# Patient Record
Sex: Female | Born: 1965 | Race: White | Hispanic: No | Marital: Married | Smoking: Never smoker
Health system: Southern US, Community
[De-identification: ages and names within clinical notes are randomized; demographics above are authoritative.]

## PROBLEM LIST (undated history)

## (undated) DIAGNOSIS — E7800 Pure hypercholesterolemia, unspecified: Secondary | ICD-10-CM

## (undated) DIAGNOSIS — E2839 Other primary ovarian failure: Secondary | ICD-10-CM

## (undated) DIAGNOSIS — M1990 Unspecified osteoarthritis, unspecified site: Secondary | ICD-10-CM

## (undated) HISTORY — PX: ABDOMINAL HYSTERECTOMY: SHX81

---

## 1998-03-23 ENCOUNTER — Inpatient Hospital Stay (HOSPITAL_COMMUNITY): Admission: AD | Admit: 1998-03-23 | Discharge: 1998-03-23 | Payer: Self-pay | Admitting: *Deleted

## 1998-03-27 ENCOUNTER — Inpatient Hospital Stay (HOSPITAL_COMMUNITY): Admission: AD | Admit: 1998-03-27 | Discharge: 1998-03-29 | Payer: Self-pay | Admitting: Obstetrics and Gynecology

## 1998-05-11 ENCOUNTER — Other Ambulatory Visit: Admission: RE | Admit: 1998-05-11 | Discharge: 1998-05-11 | Payer: Self-pay | Admitting: Obstetrics and Gynecology

## 1999-05-18 ENCOUNTER — Other Ambulatory Visit: Admission: RE | Admit: 1999-05-18 | Discharge: 1999-05-18 | Payer: Self-pay | Admitting: Gynecology

## 2000-06-20 ENCOUNTER — Other Ambulatory Visit: Admission: RE | Admit: 2000-06-20 | Discharge: 2000-06-20 | Payer: Self-pay | Admitting: Gynecology

## 2001-07-03 ENCOUNTER — Other Ambulatory Visit: Admission: RE | Admit: 2001-07-03 | Discharge: 2001-07-03 | Payer: Self-pay | Admitting: Gynecology

## 2002-03-06 ENCOUNTER — Ambulatory Visit (HOSPITAL_COMMUNITY): Admission: RE | Admit: 2002-03-06 | Discharge: 2002-03-06 | Payer: Self-pay | Admitting: Obstetrics and Gynecology

## 2002-03-06 ENCOUNTER — Encounter: Payer: Self-pay | Admitting: Obstetrics and Gynecology

## 2002-08-07 ENCOUNTER — Inpatient Hospital Stay (HOSPITAL_COMMUNITY): Admission: AD | Admit: 2002-08-07 | Discharge: 2002-08-09 | Payer: Self-pay | Admitting: Obstetrics and Gynecology

## 2002-09-20 ENCOUNTER — Other Ambulatory Visit: Admission: RE | Admit: 2002-09-20 | Discharge: 2002-09-20 | Payer: Self-pay | Admitting: Obstetrics and Gynecology

## 2003-11-19 ENCOUNTER — Other Ambulatory Visit: Admission: RE | Admit: 2003-11-19 | Discharge: 2003-11-19 | Payer: Self-pay | Admitting: Gynecology

## 2004-12-13 ENCOUNTER — Other Ambulatory Visit: Admission: RE | Admit: 2004-12-13 | Discharge: 2004-12-13 | Payer: Self-pay | Admitting: Gynecology

## 2006-02-14 ENCOUNTER — Other Ambulatory Visit: Admission: RE | Admit: 2006-02-14 | Discharge: 2006-02-14 | Payer: Self-pay | Admitting: Gynecology

## 2007-07-10 ENCOUNTER — Other Ambulatory Visit: Admission: RE | Admit: 2007-07-10 | Discharge: 2007-07-10 | Payer: Self-pay | Admitting: Gynecology

## 2007-10-22 ENCOUNTER — Encounter: Admission: RE | Admit: 2007-10-22 | Discharge: 2007-11-27 | Payer: Self-pay | Admitting: Family Medicine

## 2009-04-16 ENCOUNTER — Encounter: Admission: RE | Admit: 2009-04-16 | Discharge: 2009-04-16 | Payer: Self-pay | Admitting: Gynecology

## 2010-02-28 ENCOUNTER — Encounter: Payer: Self-pay | Admitting: Gynecology

## 2010-03-15 NOTE — H&P (Signed)
  NAMEDAVI, [name]            ACCOUNT NO.:  [id number]  MEDICAL RECORD NO.:  [id number]          PATIENT TYPE:  AMB  LOCATION:  SDC                           FACILITY:  [facility]  PHYSICIAN:  Guy Sandifer. Henderson Cloud, M.D. DATE OF BIRTH:  1965-06-29  DATE OF ADMISSION: DATE OF DISCHARGE:                             HISTORY & PHYSICAL   CHIEF COMPLAINT:  Leaking urine and pelvic relaxation.  HISTORY OF PRESENT ILLNESS:  This patient is a 45 year old married white female G8, P3 with a Mirena IUD.  She has symptoms of heaviness in her pelvis, which is getting progressively worse.  She also leaks urine with coughing and sneezing requiring a panty liner.  Evaluation in the office is consistent with genuine stress urinary continence on urodynamic studies.  Ultrasound on October 23, 2009, revealed uterus measuring 7.9 x 4.3 x 5.8 cm.  The ovaries appeared normal.  After a careful discussion of options, risks, benefits, she is being admitted for laparoscopically-assisted vaginal hysterectomy, anterior-posterior colporrhaphy, and a midurethral sling.  PAST MEDICAL HISTORY:  Negative.  PAST SURGICAL HISTORY:  Laparoscopy and D and C.  OBSTETRICAL HISTORY:  Vaginal delivery x3.  FAMILY HISTORY:  Positive for UTI, diabetes, and breast cancer.  MEDICATIONS:  None.  ALLERGIES:  SULFA.  SOCIAL HISTORY:  Denies tobacco, alcohol, or drug abuse.  REVIEW OF SYSTEMS:  NEUROLOGIC:  Denies headache.  CARDIAC:  Denies chest pain.  PULMONARY:  Denies shortness of breath.  PHYSICAL EXAMINATION:  VITAL SIGNS:  Height 5 feet 7 inches, weight 167 pounds, blood pressure 120/84. LUNGS:  Clear to auscultation. HEART:  Regular rate and rhythm. ABDOMEN:  Soft, nontender without masses. PELVIC:  Vulva, vagina, and cervix without lesion.  First to second degree cystocele and rectocele noted.  Uterus is very mobile, nontender. Adnexa nontender without masses.  Rectal exam reveals good rectal sphincter tone  with a lax rectovaginal septum. EXTREMITIES:  Grossly within normal limits. NEUROLOGICAL:  Grossly within normal limits.  ASSESSMENT: 1. Pelvic relaxation. 2. Stress urinary continence.  PLAN:  Laparoscopically-assisted vaginal hysterectomy, anterior- posterior colporrhaphy, and midurethral sling.     Guy Sandifer Henderson Cloud, M.D.     JET/MEDQ  D:  03/04/2010  T:  03/05/2010  Job:  [id number]  Electronically Signed by Harold Hedge M.D. on 03/15/2010 09:03:05 AM

## 2010-03-18 ENCOUNTER — Encounter (HOSPITAL_COMMUNITY)
Admission: RE | Admit: 2010-03-18 | Discharge: 2010-03-18 | Disposition: A | Discharge status: Home / Self Care | Payer: BC Managed Care – PPO | Source: Ambulatory Visit | Attending: Obstetrics and Gynecology | Admitting: Obstetrics and Gynecology

## 2010-03-18 DIAGNOSIS — Z01812 Encounter for preprocedural laboratory examination: Secondary | ICD-10-CM | POA: Insufficient documentation

## 2010-03-18 LAB — CBC
HCT: 42.1 % (ref 36.0–46.0)
Hemoglobin: 14.4 g/dL (ref 12.0–15.0)
MCH: 29.7 pg (ref 26.0–34.0)
MCHC: 34.2 g/dL (ref 30.0–36.0)
MCV: 86.8 fL (ref 78.0–100.0)
Platelets: 198 10*3/uL (ref 150–400)
RBC: 4.85 MIL/uL (ref 3.87–5.11)
RDW: 12.6 % (ref 11.5–15.5)
WBC: 5.8 10*3/uL (ref 4.0–10.5)

## 2010-03-18 LAB — SURGICAL PCR SCREEN: Staphylococcus aureus: INVALID — AB

## 2010-03-20 LAB — MRSA CULTURE

## 2010-03-25 ENCOUNTER — Other Ambulatory Visit: Payer: Self-pay | Admitting: Obstetrics and Gynecology

## 2010-03-25 ENCOUNTER — Ambulatory Visit (HOSPITAL_COMMUNITY)
Admission: RE | Admit: 2010-03-25 | Discharge: 2010-03-26 | Disposition: A | Discharge status: Home / Self Care | Payer: BC Managed Care – PPO | Source: Ambulatory Visit | Attending: Obstetrics and Gynecology | Admitting: Obstetrics and Gynecology

## 2010-03-25 DIAGNOSIS — N393 Stress incontinence (female) (male): Secondary | ICD-10-CM | POA: Insufficient documentation

## 2010-03-25 DIAGNOSIS — N812 Incomplete uterovaginal prolapse: Secondary | ICD-10-CM | POA: Insufficient documentation

## 2010-03-25 DIAGNOSIS — N83209 Unspecified ovarian cyst, unspecified side: Secondary | ICD-10-CM | POA: Insufficient documentation

## 2010-03-25 DIAGNOSIS — Z01812 Encounter for preprocedural laboratory examination: Secondary | ICD-10-CM | POA: Insufficient documentation

## 2010-03-26 LAB — CBC
HCT: 33.6 % — ABNORMAL LOW (ref 36.0–46.0)
Hemoglobin: 11.5 g/dL — ABNORMAL LOW (ref 12.0–15.0)
MCH: 29.5 pg (ref 26.0–34.0)
RBC: 3.9 MIL/uL (ref 3.87–5.11)

## 2010-03-27 NOTE — Op Note (Signed)
[name], [name]            ACCOUNT NO.:  [id number]  MEDICAL RECORD NO.:  [id number]           PATIENT TYPE:  O  LOCATION:  9317                          FACILITY:  [facility]  PHYSICIAN:  Guy Sandifer. Henderson Cloud, M.D. DATE OF BIRTH:  [birth year]  DATE OF PROCEDURE:  03/25/2010 DATE OF DISCHARGE:                              OPERATIVE REPORT   PREOPERATIVE DIAGNOSES: 1. Pelvic relaxation. 2. Stress urinary continence.  POSTOPERATIVE DIAGNOSES: 1. Pelvic relaxation. 2. Stress urinary continence 3. Left ovarian cyst.  PROCEDURES:  Laparoscopically-assisted vaginal hysterectomy with biopsy of left ovary.  Anterior colporrhaphy, posterior colporrhaphy, transobturator midurethral sling and cystoscopy.  SURGEON:  Guy Sandifer. Henderson Cloud, MD  ASSISTANT:  Duke Salvia. Marcelle Overlie, MD  ANESTHESIA:  General with endotracheal intubation.  SPECIMENS: 1. Uterus. 2. Left ovarian biopsy.  Both to pathology.  ESTIMATED BLOOD LOSS:  375 mL.  INDICATIONS AND CONSENT:  The patient is a 45 year old married white female G8, P3 with Mirena IUD in place.  She has symptomatic pelvic relaxation and stress incontinence.  Details are dictated in the history and physical.  Laparoscopically-assisted vaginal hysterectomy with anterior-posterior colporrhaphy and transobturator midurethral sling have been discussed preoperatively.  Potential risks and complications have been discussed preoperatively including but not limited to infection, organ damage, bleeding requiring transfusion of blood products with HIV and hepatitis acquisition, DVT, PE, pneumonia, fistula formation, pelvic pain, painful intercourse, recurrent relaxation, laparotomy, and return to the operating room.  Success and failure rates of the sling, postoperative irritative voiding symptoms, recurrent stress incontinence, delayed healing, extrusion, erosion, painful intercourse, pelvic pain, return to the operating room, prolonged catheterization, and  self-catheterization have been reviewed.  All questions were answered and consent is signed on the chart.  PROCEDURE:  The patient was taken to the operating room where she was identified and placed in a dorsal supine position and general anesthesia was induced via endotracheal intubation.  She was placed in a dorsal lithotomy position.  A time-out was undertaken.  She was prepped abdominally and vaginally.  IUD was removed intact.  Hulka tenaculum was placed in the uterus using manipulator and she was draped in a sterile fashion.  The infraumbilical and suprapubic areas were injected in the midline with approximately 6 mL of 0.5% plain Marcaine.  A small infraumbilical incision was made.  Disposable Veress needle was placed on the first attempt without difficulty.  A good syringe and drop test are noted.  Two liters of gas were then insufflated under low pressure with good tympany in the right upper quadrant.  Veress needle was removed.  A 10/11 XL bladeless disposable trocar sleeve was then placed using direct visualization with diagnostic laparoscope.  After placement, the operative laparoscope was used.  Small suprapubic incision was made in the midline and a 5-mm disposable trocar sleeve was placed under direct visualization without difficulty.  Inspection reveals the upper abdomen grossly normal.  Appendix was retrocecal and normal.  Uterus was retroverted about 6 weeks in size.  Anterior and posterior cul-de-sacs were normal.  Right tube and ovary were normal. Left tube was normal.  Left ovary contained a 1-2 cm smooth hemorrhagic- type cyst.  This was resected in a simple fashion with scissors and sent to Pathology.  Bipolar cautery was used to obtain complete hemostasis on the ovary.  Then using the Enseal bipolar cautery cutting instrument, the proximal ligament was taken down bilaterally to the level of the vesicouterine peritoneum.  Vesicouterine peritoneum was taken  down cephalolaterally.  Suprapubic trocar sleeve was removed.  Instruments were removed.  Attention was turned to the vagina.  Posterior cul-de-sac was entered sharply.  Cervix was circumscribed with unipolar cautery. Mucosa was advanced sharply and bluntly.  Then, using the handheld [employer] super jaws products, the uterosacral ligaments were taken down followed by the bladder pillars and the cardinal ligament.  After approximately 8-10 pedicles, it was felt that while this instrument was cauterizing well it was giving the correct tone when the blade was deployed.  Therefore, the LigaSure handheld bipolar cautery device was used.  However, this also failed to generate properly, therefore the remainder of the pedicles were taken down in the traditional fashion using 0 Monocryl sutures.  The uterine vessels were taken down bilaterally.  Fundus was delivered posteriorly and the remaining pedicles were taken down and the specimen was completely delivered. Uterosacral ligament was then plicated to the vaginal cuff bilaterally using 0 Monocryl suture.  All suture would be 0 Monocryl unless otherwise designated.  Uterosacral ligament was then plicated in the midline with 2 separate sutures.  Posterior half of the cuff was then placed.  The anterior vaginal mucosa was then infiltrated with a dilute solution of 20 mL of 0.5% plain Marcaine with 1:200,000 epinephrine diluted in 100 mL of normal saline.  The anterior vaginal mucosa was then taken down in the midline sharply from the underlying bladder.  It was then dissected bilaterally sharply and bluntly.  The bladder was then reduced with purse-string suture and then additional plication sutures were used to reapproximate the vesicovaginal fascia.  Excess vaginal mucosa was trimmed.  Anterior half of the cuff was closed with 0 Monocryl pops and then the anterior vaginal mucosa was closed in a running locking fashion with 2-0 Vicryl suture.  Foley  catheter was then placed in the bladder and clear urine was noted.  Foley was left in place.  The entry urethral mucosa was injected with same dilute solution.  The points of transobturator incisions were marked bilaterally and were also injected with the same solution and a stab incision was made bilaterally.  A small linear incision was made in the midline suburethrally as well and dissection was carried out bilaterally toward the urogenital diaphragm with Strully scissors.  The Obtryx halo needles were then placed bilaterally through the obturator foramen exiting the needle tip below the urethra with passage of the needle tip guide with the examining finger.  Care was taken to make sure there were no perforation of the vaginal mucosa.  The patient was being given indigo carmine.  Foley catheter was removed and cystoscopy with a 70- degree cystoscope was then carried out.  A 360-degree inspection revealed no evidence of foreign body, no evidence of perforation, and good puff of urine from the ureters bilaterally.  Cystoscope was removed.  Foley catheter was replaced.  The Obtryx sling was then placed on the needle tips which were withdrawn back through the incisions bilaterally.  Sheath was removed.  Care was taken to make sure the sling was lying flat with no rolls or kinks.  A Kelly was placed below the sling and could be rotated perpendicular to the floor  with no tension on the sling.  The vaginal mucosa was closed in a running locking fashion with 2-0 Vicryl suture and a 4-0 Vicryl was used to close the inguinal incisions bilaterally.  Posterior vaginal mucosa was then infiltrated with a same dilute solution.  Diamond-shaped wedge of tissue was removed from the posterior perineal body and the posterior vaginal mucosa was dissected from the underlying rectum in the midline with scissors. Dissection was carried out bilaterally sharply and bluntly.  0 Monocryl interrupted sutures used  to reapproximate the rectovaginal fascia. Excess mucosa was trimmed and the posterior vaginal mucosa was closed in a running locking fashion with 2-0 Vicryl suture down the level of the perineal body.  Perineal body was reapproximated with interrupted 0 Monocryl sutures and then a 2-0 Vicryl was carried on down the standard episiotomy-type fashion to close the remainder of the incision.  Vagina was packed with 2-0 plain packing with estrogen cream.  Attention was turned to the abdomen.  Pneumoperitoneum was recreated.  Suprapubic trocar sleeve was reinserted under direct visualization and inspection revealed only minor oozing at peritoneal edges.  This was controlled with bipolar cautery.  A piece of Gelfoam was placed over the vaginal cuff to assure complete hemostasis.  Remaining 20 mL of 2.5% plain Marcaine was instilled into the peritoneal cavity.  Instruments were removed and pneumoperitoneum was removed.  Both incisions were closed with 2-0 Vicryl suture.  Dressings were applied.  All counts were correct.  The patient was awakened and taken to recovery room in stable condition.     Guy Sandifer Henderson Cloud, M.D.     JET/MEDQ  D:  03/25/2010  T:  03/25/2010  Job:  [id number]  Electronically Signed by Harold Hedge M.D. on 03/27/2010 12:10:31 PM

## 2010-06-25 NOTE — Discharge Summary (Signed)
[name], [name]                        ACCOUNT NO.:  [id number]   MEDICAL RECORD NO.:  [id number]                   PATIENT TYPE:  INP   LOCATION:  9147                                 FACILITY:  [facility]   PHYSICIAN:  Zenaida Niece, M.D.             DATE OF BIRTH:  [birth year]   DATE OF ADMISSION:  08/07/2002  DATE OF DISCHARGE:  08/09/2002                                 DISCHARGE SUMMARY   ADMISSION DIAGNOSES:  1. Intrauterine pregnancy at 39 weeks.  2. Advanced maternal age.   DISCHARGE DIAGNOSES:  1. Intrauterine pregnancy at 39 weeks.  2. Advanced maternal age.   PROCEDURE:  On June 30 she had a spontaneous vaginal delivery.   HISTORY AND PHYSICAL:  This is a 45 year old white female gravida 7, para 2-  0-4-2 with an EGA of 39+ weeks who presents for an induction with a  favorable cervix.  Prenatal care complicated by advanced maternal age for  which she declined amniocentesis.  Had a normal triple screen, a normal  ultrasound.   PRENATAL LABORATORY DATA:  Blood type AB negative with a negative antibody  screen.  RPR nonreactive.  Rubella immune.  Hepatitis B surface antigen  negative.  Gonorrhea and Chlamydia negative.  Triple screen normal.  Group B  strep negative.   POST OBSTETRICAL HISTORY:  Two vaginal deliveries at 40 weeks, one 8 pounds  14 ounces and one 9 pounds.   GYNECOLOGIC HISTORY:  History of infertility and endometriosis.   PAST SURGICAL HISTORY:  1. Dilation and evacuation laparoscopy x 2.  2. Wisdom tooth removal.   ALLERGIES:  1. SULFA.  2. TAPE.   PHYSICAL EXAMINATION:  VITAL SIGNS:  She is afebrile with stable vital  signs.  Fetal heart tracing reactive with occasional contractions.  ABDOMEN:  Gravid, nontender with an estimated fetal weight of 8 pounds.  PELVIC:  Vaginal exam is 2, 40, -1 to -2, vertex presentation and amniotomy  revealed clear fluid.   HOSPITAL COURSE:  Patient was admitted and had amniotomy performed for  induction.  She then walked and then required Pitocin to enter active labor.  She received an epidural, continued to progress, reached complete and pushed  well.  On the evening of June 30 she had a vaginal delivery of a viable  female infant with Apgar's of 9 and 9 and weighed 9 pounds 5 ounces.  Baby  delivered through a shoulder cord.  Placenta delivered spontaneous and was  intact and had a 3-vessel cord.  Perineum was intact and estimated blood  loss was approximately 500 cc.   Postpartum patient did very well, remained afebrile and breast fed her baby  without complications.   Pre-delivery hemoglobin 13.4, post-delivery 10.9.   On the morning of postpartum day #2 she was stable for discharge home.   DISCHARGE INSTRUCTIONS:  1. Regular diet.  2. Pelvic rest.  3. Follow up in six weeks.  MEDICATIONS:  Over the counter Motrin as needed.   She is given our discharge pamphlet.                                                  Zenaida Niece, M.D.    TDM/MEDQ  D:  08/09/2002  T:  08/09/2002  Job:  [id number]

## 2016-07-27 ENCOUNTER — Other Ambulatory Visit: Payer: Self-pay | Admitting: Obstetrics and Gynecology

## 2016-07-27 DIAGNOSIS — R928 Other abnormal and inconclusive findings on diagnostic imaging of breast: Secondary | ICD-10-CM

## 2016-07-29 ENCOUNTER — Ambulatory Visit
Admission: RE | Admit: 2016-07-29 | Discharge: 2016-07-29 | Disposition: A | Discharge status: Home / Self Care | Payer: BLUE CROSS/BLUE SHIELD | Source: Ambulatory Visit | Attending: Obstetrics and Gynecology | Admitting: Obstetrics and Gynecology

## 2016-07-29 ENCOUNTER — Ambulatory Visit
Admission: RE | Admit: 2016-07-29 | Discharge: 2016-07-29 | Disposition: A | Discharge status: Home / Self Care | Payer: Self-pay | Source: Ambulatory Visit | Attending: Obstetrics and Gynecology | Admitting: Obstetrics and Gynecology

## 2016-07-29 DIAGNOSIS — R928 Other abnormal and inconclusive findings on diagnostic imaging of breast: Secondary | ICD-10-CM

## 2018-09-03 ENCOUNTER — Other Ambulatory Visit: Payer: Self-pay | Admitting: Nurse Practitioner

## 2018-09-03 ENCOUNTER — Other Ambulatory Visit: Payer: Self-pay | Admitting: Obstetrics and Gynecology

## 2018-09-03 DIAGNOSIS — Z1231 Encounter for screening mammogram for malignant neoplasm of breast: Secondary | ICD-10-CM

## 2018-10-19 ENCOUNTER — Other Ambulatory Visit: Payer: Self-pay

## 2018-10-19 ENCOUNTER — Ambulatory Visit
Admission: RE | Admit: 2018-10-19 | Discharge: 2018-10-19 | Disposition: A | Discharge status: Home / Self Care | Payer: BC Managed Care – PPO | Source: Ambulatory Visit | Attending: Nurse Practitioner | Admitting: Nurse Practitioner

## 2018-10-19 DIAGNOSIS — Z1231 Encounter for screening mammogram for malignant neoplasm of breast: Secondary | ICD-10-CM

## 2018-10-22 ENCOUNTER — Other Ambulatory Visit: Payer: Self-pay | Admitting: Nurse Practitioner

## 2018-10-22 DIAGNOSIS — R928 Other abnormal and inconclusive findings on diagnostic imaging of breast: Secondary | ICD-10-CM

## 2018-10-26 ENCOUNTER — Ambulatory Visit
Admission: RE | Admit: 2018-10-26 | Discharge: 2018-10-26 | Disposition: A | Discharge status: Home / Self Care | Payer: BC Managed Care – PPO | Source: Ambulatory Visit | Attending: Nurse Practitioner | Admitting: Nurse Practitioner

## 2018-10-26 ENCOUNTER — Other Ambulatory Visit: Payer: Self-pay

## 2018-10-26 ENCOUNTER — Ambulatory Visit: Payer: BLUE CROSS/BLUE SHIELD

## 2018-10-26 DIAGNOSIS — R928 Other abnormal and inconclusive findings on diagnostic imaging of breast: Secondary | ICD-10-CM

## 2019-02-19 ENCOUNTER — Ambulatory Visit: Payer: BC Managed Care – PPO | Attending: Internal Medicine

## 2019-02-19 DIAGNOSIS — Z23 Encounter for immunization: Secondary | ICD-10-CM | POA: Diagnosis not present

## 2019-02-19 NOTE — Progress Notes (Signed)
   Covid-19 Vaccination Clinic  Name:  [name]    MRN: [id number] DOB: 1965-03-02  02/19/2019  [name] was observed post Covid-19 immunization for 15 minutes without incidence. She was provided with Vaccine Information Sheet and instruction to access the V-Safe system.   [name] was instructed to call 911 with any severe reactions post vaccine: Marland Kitchen Difficulty breathing  . Swelling of your face and throat  . A fast heartbeat  . A bad rash all over your body  . Dizziness and weakness    Immunizations Administered    Name Date Dose VIS Date Route   Pfizer COVID-19 Vaccine 02/19/2019  9:54 AM 0.3 mL 01/18/2019 Intramuscular   Manufacturer: [employer], [employer]   Lot: [id number]   NDC: [address]-5

## 2019-03-11 ENCOUNTER — Ambulatory Visit: Payer: BLUE CROSS/BLUE SHIELD | Attending: Internal Medicine

## 2019-03-11 DIAGNOSIS — Z23 Encounter for immunization: Secondary | ICD-10-CM | POA: Insufficient documentation

## 2019-03-11 NOTE — Progress Notes (Signed)
   Covid-19 Vaccination Clinic  Name:  [name]    MRN: [id number] DOB: 1965-11-08  03/11/2019  [name] was observed post Covid-19 immunization for 15 minutes without incidence. She was provided with Vaccine Information Sheet and instruction to access the V-Safe system.   [name] was instructed to call 911 with any severe reactions post vaccine: Marland Kitchen Difficulty breathing  . Swelling of your face and throat  . A fast heartbeat  . A bad rash all over your body  . Dizziness and weakness    Immunizations Administered    Name Date Dose VIS Date Route   Pfizer COVID-19 Vaccine 03/11/2019  9:27 AM 0.3 mL 01/18/2019 Intramuscular   Manufacturer: [employer], [employer]   Lot: [id number]   NDC: [address]-2

## 2019-09-17 ENCOUNTER — Other Ambulatory Visit: Payer: Self-pay | Admitting: Nurse Practitioner

## 2019-09-17 DIAGNOSIS — Z1231 Encounter for screening mammogram for malignant neoplasm of breast: Secondary | ICD-10-CM

## 2019-10-23 ENCOUNTER — Other Ambulatory Visit: Payer: Self-pay

## 2019-10-23 ENCOUNTER — Ambulatory Visit
Admission: RE | Admit: 2019-10-23 | Discharge: 2019-10-23 | Disposition: A | Discharge status: Home / Self Care | Payer: Managed Care, Other (non HMO) | Source: Ambulatory Visit | Attending: Nurse Practitioner | Admitting: Nurse Practitioner

## 2019-10-23 DIAGNOSIS — Z1231 Encounter for screening mammogram for malignant neoplasm of breast: Secondary | ICD-10-CM

## 2020-11-25 ENCOUNTER — Other Ambulatory Visit: Payer: Self-pay | Admitting: Nurse Practitioner

## 2020-11-25 DIAGNOSIS — E2839 Other primary ovarian failure: Secondary | ICD-10-CM

## 2020-11-30 ENCOUNTER — Other Ambulatory Visit: Payer: Self-pay | Admitting: Nurse Practitioner

## 2020-11-30 DIAGNOSIS — Z1231 Encounter for screening mammogram for malignant neoplasm of breast: Secondary | ICD-10-CM

## 2020-11-30 DIAGNOSIS — E2839 Other primary ovarian failure: Secondary | ICD-10-CM

## 2020-12-17 ENCOUNTER — Ambulatory Visit
Admission: RE | Admit: 2020-12-17 | Discharge: 2020-12-17 | Disposition: A | Discharge status: Home / Self Care | Payer: Managed Care, Other (non HMO) | Source: Ambulatory Visit | Attending: Nurse Practitioner | Admitting: Nurse Practitioner

## 2020-12-17 ENCOUNTER — Other Ambulatory Visit: Payer: Self-pay

## 2020-12-17 DIAGNOSIS — Z1231 Encounter for screening mammogram for malignant neoplasm of breast: Secondary | ICD-10-CM

## 2020-12-17 DIAGNOSIS — E2839 Other primary ovarian failure: Secondary | ICD-10-CM

## 2020-12-18 ENCOUNTER — Other Ambulatory Visit: Payer: Self-pay | Admitting: Nurse Practitioner

## 2020-12-18 DIAGNOSIS — R928 Other abnormal and inconclusive findings on diagnostic imaging of breast: Secondary | ICD-10-CM

## 2020-12-21 ENCOUNTER — Ambulatory Visit
Admission: RE | Admit: 2020-12-21 | Discharge: 2020-12-21 | Disposition: A | Discharge status: Home / Self Care | Payer: Managed Care, Other (non HMO) | Source: Ambulatory Visit | Attending: Nurse Practitioner | Admitting: Nurse Practitioner

## 2020-12-21 ENCOUNTER — Other Ambulatory Visit: Payer: Managed Care, Other (non HMO)

## 2020-12-21 ENCOUNTER — Other Ambulatory Visit: Payer: Self-pay

## 2020-12-21 DIAGNOSIS — R928 Other abnormal and inconclusive findings on diagnostic imaging of breast: Secondary | ICD-10-CM

## 2021-10-28 ENCOUNTER — Other Ambulatory Visit: Payer: Self-pay | Admitting: Nurse Practitioner

## 2021-10-28 DIAGNOSIS — Z1231 Encounter for screening mammogram for malignant neoplasm of breast: Secondary | ICD-10-CM

## 2021-12-22 ENCOUNTER — Ambulatory Visit
Admission: RE | Admit: 2021-12-22 | Discharge: 2021-12-22 | Disposition: A | Discharge status: Home / Self Care | Payer: BC Managed Care – PPO | Source: Ambulatory Visit | Attending: Nurse Practitioner | Admitting: Nurse Practitioner

## 2021-12-22 DIAGNOSIS — Z1231 Encounter for screening mammogram for malignant neoplasm of breast: Secondary | ICD-10-CM

## 2021-12-24 ENCOUNTER — Other Ambulatory Visit: Payer: Self-pay | Admitting: Nurse Practitioner

## 2021-12-24 DIAGNOSIS — R928 Other abnormal and inconclusive findings on diagnostic imaging of breast: Secondary | ICD-10-CM

## 2022-01-12 ENCOUNTER — Encounter: Payer: Self-pay | Admitting: Nurse Practitioner

## 2022-01-12 ENCOUNTER — Ambulatory Visit
Admission: RE | Admit: 2022-01-12 | Discharge: 2022-01-12 | Disposition: A | Discharge status: Home / Self Care | Payer: BC Managed Care – PPO | Source: Ambulatory Visit | Attending: Nurse Practitioner | Admitting: Nurse Practitioner

## 2022-01-12 DIAGNOSIS — R928 Other abnormal and inconclusive findings on diagnostic imaging of breast: Secondary | ICD-10-CM

## 2022-01-13 ENCOUNTER — Other Ambulatory Visit: Payer: Self-pay | Admitting: Nurse Practitioner

## 2022-01-13 DIAGNOSIS — N6489 Other specified disorders of breast: Secondary | ICD-10-CM

## 2022-01-21 ENCOUNTER — Ambulatory Visit
Admission: RE | Admit: 2022-01-21 | Discharge: 2022-01-21 | Disposition: A | Discharge status: Home / Self Care | Payer: BC Managed Care – PPO | Source: Ambulatory Visit | Attending: Nurse Practitioner | Admitting: Nurse Practitioner

## 2022-01-21 DIAGNOSIS — N6489 Other specified disorders of breast: Secondary | ICD-10-CM

## 2022-01-21 HISTORY — PX: BREAST BIOPSY: SHX20

## 2022-02-10 ENCOUNTER — Other Ambulatory Visit: Payer: Self-pay | Admitting: Surgery

## 2022-02-10 DIAGNOSIS — N6489 Other specified disorders of breast: Secondary | ICD-10-CM

## 2022-02-22 ENCOUNTER — Other Ambulatory Visit: Payer: Self-pay | Admitting: Surgery

## 2022-02-22 DIAGNOSIS — N6489 Other specified disorders of breast: Secondary | ICD-10-CM

## 2022-03-10 ENCOUNTER — Other Ambulatory Visit: Payer: Self-pay

## 2022-03-10 ENCOUNTER — Encounter (HOSPITAL_BASED_OUTPATIENT_CLINIC_OR_DEPARTMENT_OTHER): Payer: Self-pay | Admitting: Surgery

## 2022-03-16 ENCOUNTER — Ambulatory Visit
Admission: RE | Admit: 2022-03-16 | Discharge: 2022-03-16 | Disposition: A | Discharge status: Home / Self Care | Payer: BC Managed Care – PPO | Source: Ambulatory Visit | Attending: Surgery | Admitting: Surgery

## 2022-03-16 DIAGNOSIS — N6489 Other specified disorders of breast: Secondary | ICD-10-CM

## 2022-03-16 HISTORY — PX: BREAST BIOPSY: SHX20

## 2022-03-16 NOTE — H&P (Signed)
REFERRING PHYSICIAN: Fonnie Jarvis, NP  PROVIDER: Beverlee Nims, MD  MRN: [id number] DOB: [birth year]  Subjective  Chief Complaint: New Consultation (RIGHT breast Complex Sclerosing Lesion)   History of Present Illness: [name] is a 57 y.o. female who is seen today as an office consultation for evaluation of New Consultation (RIGHT breast Complex Sclerosing Lesion) . This is a 57 year old female who was found on recent screening mammography to have 2 areas of distortion in the right breast. She underwent 2 separate biopsies with both show the complex grossing lesions. There was no other atypia or malignancy identified. She is otherwise healthy without complaints. She has had no previous problems regarding her breast. She denies nipple discharge. She has a family history of breast cancer in her paternal grandmother which may have been in her 48s or 71s. She has no cardiopulmonary issues.   Review of Systems: A complete review of systems was obtained from the patient. I have reviewed this information and discussed as appropriate with the patient. See HPI as well for other ROS.  ROS  Medical History: Past Medical History: Diagnosis Date Arthritis  There is no problem list on file for this patient.  Past Surgical History: Procedure Laterality Date BREAST EXCISIONAL BIOPSY LAPAROSCOPIC VAGINAL HYSTERECTOMY [place] teeth   Allergies Allergen Reactions Macrolide Antibiotics Rash Sulfa (Sulfonamide Antibiotics) Rash and Unknown Other reaction(s): Unknown  Current Outpatient Medications on File Prior to Visit Medication Sig Dispense Refill atorvastatin (LIPITOR) 20 MG tablet Take 1 tablet by mouth once daily estradiol (VIVELLE-DOT) patch 0.05 mg/24 hr PLACE 1 PATCH ONTO THE SKIN 2 TIMES PER WEEK  No current facility-administered medications on file prior to visit.  Family History Problem Relation Age of Onset Stroke Mother Heart valve disease  Mother High blood pressure (Hypertension) Mother High blood pressure (Hypertension) Father Stroke Father   Social History  Tobacco Use Smoking Status Never Smokeless Tobacco Never   Social History  Socioeconomic History Marital status: Married Tobacco Use Smoking status: Never Smokeless tobacco: Never Substance and Sexual Activity Alcohol use: Never Drug use: Never  Objective:  Vitals: 02/10/22 1524 02/10/22 1525 BP: 122/78 Pulse: 82 Temp: 37.1 C (98.7 F) SpO2: 99% Weight: 79.4 kg (175 lb) PainSc: 0-No pain  There is no height or weight on file to calculate BMI.  Physical Exam  She appears well on exam  There is minimal ecchymosis from the breast biopsies of the right breast. There is no underlying hematoma. There are no palpable breast masses. The right breast nipple areolar complex is normal.  There is no axillary adenopathy.  Labs, Imaging and Diagnostic Testing: Reviewed her mammograms, ultrasound, and pathology results.  Assessment and Plan:  Diagnoses and all orders for this visit:  Complex sclerosing lesion of right breast    I gave her a copy of the pathology results. Again she had 2 separate biopsies of the right breast both showing complex grossing lesions. I discussed the pathologic findings with her. We discussed the small potential that these could be hiding small malignancies and thus why surgical excision of these areas is recommended. We discussed close follow-up with repeat mammograms in 6 months if she desire not to have surgery. From a surgical standpoint, I discussed proceeding with a radioactive seed guided right breast lumpectomy x 2 to remove these 2 areas. I explained the surgical procedure in detail. We discussed the risks which includes but is not limited to bleeding, infection, injury to surrounding structures, the need for further surgery if malignancy is  found, cardiopulmonary issues, postoperative recovery, etc. She understands  and is leaning toward proceeding with surgery. I will go ahead and fill out the orders for surgery and we will plan to proceed

## 2022-03-17 ENCOUNTER — Ambulatory Visit
Admission: RE | Admit: 2022-03-17 | Discharge: 2022-03-17 | Disposition: A | Discharge status: Home / Self Care | Payer: BC Managed Care – PPO | Source: Ambulatory Visit | Attending: Surgery | Admitting: Surgery

## 2022-03-17 ENCOUNTER — Ambulatory Visit (HOSPITAL_BASED_OUTPATIENT_CLINIC_OR_DEPARTMENT_OTHER)
Admission: RE | Admit: 2022-03-17 | Discharge: 2022-03-17 | Disposition: A | Discharge status: Home / Self Care | Payer: BC Managed Care – PPO | Source: Ambulatory Visit | Attending: Surgery | Admitting: Surgery

## 2022-03-17 ENCOUNTER — Other Ambulatory Visit: Payer: Self-pay

## 2022-03-17 ENCOUNTER — Encounter (HOSPITAL_BASED_OUTPATIENT_CLINIC_OR_DEPARTMENT_OTHER): Payer: Self-pay | Admitting: Surgery

## 2022-03-17 ENCOUNTER — Encounter (HOSPITAL_BASED_OUTPATIENT_CLINIC_OR_DEPARTMENT_OTHER)
Admission: RE | Disposition: A | Discharge status: Home / Self Care | Payer: Self-pay | Source: Ambulatory Visit | Attending: Surgery

## 2022-03-17 ENCOUNTER — Ambulatory Visit (HOSPITAL_BASED_OUTPATIENT_CLINIC_OR_DEPARTMENT_OTHER): Payer: BC Managed Care – PPO | Admitting: Certified Registered"

## 2022-03-17 DIAGNOSIS — N6011 Diffuse cystic mastopathy of right breast: Secondary | ICD-10-CM | POA: Diagnosis not present

## 2022-03-17 DIAGNOSIS — Z803 Family history of malignant neoplasm of breast: Secondary | ICD-10-CM | POA: Diagnosis not present

## 2022-03-17 DIAGNOSIS — E785 Hyperlipidemia, unspecified: Secondary | ICD-10-CM | POA: Diagnosis not present

## 2022-03-17 DIAGNOSIS — N6489 Other specified disorders of breast: Secondary | ICD-10-CM | POA: Diagnosis present

## 2022-03-17 HISTORY — PX: BREAST EXCISIONAL BIOPSY: SUR124

## 2022-03-17 HISTORY — PX: BREAST LUMPECTOMY WITH RADIOACTIVE SEED LOCALIZATION: SHX6424

## 2022-03-17 HISTORY — DX: Other primary ovarian failure: E28.39

## 2022-03-17 HISTORY — DX: Unspecified osteoarthritis, unspecified site: M19.90

## 2022-03-17 HISTORY — DX: Pure hypercholesterolemia, unspecified: E78.00

## 2022-03-17 SURGERY — BREAST LUMPECTOMY WITH RADIOACTIVE SEED LOCALIZATION
Anesthesia: General | Site: Breast | Laterality: Right

## 2022-03-17 MED ORDER — BUPIVACAINE HCL (PF) 0.25 % IJ SOLN
INTRAMUSCULAR | Status: DC | PRN
  Administered 2022-03-17: 15 mL

## 2022-03-17 MED ORDER — LIDOCAINE HCL (CARDIAC) PF 100 MG/5ML IV SOSY
PREFILLED_SYRINGE | INTRAVENOUS | Status: DC | PRN
  Administered 2022-03-17: 60 mg via INTRAVENOUS

## 2022-03-17 MED ORDER — ENSURE PRE-SURGERY PO LIQD: 296.0000 mL | Freq: Once | ORAL | Status: DC

## 2022-03-17 MED ORDER — LACTATED RINGERS IV SOLN: INTRAVENOUS | Status: DC

## 2022-03-17 MED ORDER — ACETAMINOPHEN 500 MG PO TABS
1000.0000 mg | ORAL_TABLET | ORAL | Status: AC
  Administered 2022-03-17: 1000 mg via ORAL

## 2022-03-17 MED ORDER — CHLORHEXIDINE GLUCONATE CLOTH 2 % EX PADS: 6.0000 | MEDICATED_PAD | Freq: Once | CUTANEOUS | Status: DC

## 2022-03-17 MED ORDER — DEXAMETHASONE SODIUM PHOSPHATE 10 MG/ML IJ SOLN
INTRAMUSCULAR | Status: DC | PRN
  Administered 2022-03-17: 10 mg via INTRAVENOUS

## 2022-03-17 MED ORDER — ONDANSETRON HCL 4 MG/2ML IJ SOLN
INTRAMUSCULAR | Status: DC | PRN
  Administered 2022-03-17: 4 mg via INTRAVENOUS

## 2022-03-17 MED ORDER — FENTANYL CITRATE (PF) 100 MCG/2ML IJ SOLN
INTRAMUSCULAR | Status: AC
  Filled 2022-03-17: qty 2

## 2022-03-17 MED ORDER — BUPIVACAINE HCL (PF) 0.25 % IJ SOLN
INTRAMUSCULAR | Status: AC
  Filled 2022-03-17: qty 30

## 2022-03-17 MED ORDER — LIDOCAINE 2% (20 MG/ML) 5 ML SYRINGE
INTRAMUSCULAR | Status: AC
  Filled 2022-03-17: qty 5

## 2022-03-17 MED ORDER — ACETAMINOPHEN 500 MG PO TABS
ORAL_TABLET | ORAL | Status: AC
  Filled 2022-03-17: qty 2

## 2022-03-17 MED ORDER — CEFAZOLIN SODIUM-DEXTROSE 2-4 GM/100ML-% IV SOLN
2.0000 g | INTRAVENOUS | Status: AC
  Administered 2022-03-17: 2 g via INTRAVENOUS

## 2022-03-17 MED ORDER — MIDAZOLAM HCL 2 MG/2ML IJ SOLN
INTRAMUSCULAR | Status: AC
  Filled 2022-03-17: qty 2

## 2022-03-17 MED ORDER — FENTANYL CITRATE (PF) 100 MCG/2ML IJ SOLN
25.0000 ug | INTRAMUSCULAR | Status: DC | PRN
  Administered 2022-03-17: 50 ug via INTRAVENOUS
  Administered 2022-03-17 (×2): 25 ug via INTRAVENOUS

## 2022-03-17 MED ORDER — CEFAZOLIN SODIUM-DEXTROSE 2-4 GM/100ML-% IV SOLN
INTRAVENOUS | Status: AC
  Filled 2022-03-17: qty 100

## 2022-03-17 MED ORDER — PROPOFOL 10 MG/ML IV BOLUS
INTRAVENOUS | Status: DC | PRN
  Administered 2022-03-17: 150 mg via INTRAVENOUS

## 2022-03-17 MED ORDER — ATROPINE SULFATE 0.4 MG/ML IV SOLN
INTRAVENOUS | Status: AC
  Filled 2022-03-17: qty 1

## 2022-03-17 MED ORDER — PROPOFOL 10 MG/ML IV BOLUS
INTRAVENOUS | Status: AC
  Filled 2022-03-17: qty 20

## 2022-03-17 MED ORDER — MIDAZOLAM HCL 5 MG/5ML IJ SOLN
INTRAMUSCULAR | Status: DC | PRN
  Administered 2022-03-17: 2 mg via INTRAVENOUS

## 2022-03-17 MED ORDER — FENTANYL CITRATE (PF) 100 MCG/2ML IJ SOLN
INTRAMUSCULAR | Status: DC | PRN
  Administered 2022-03-17 (×2): 25 ug via INTRAVENOUS

## 2022-03-17 MED ORDER — TRAMADOL HCL 50 MG PO TABS: 50.0000 mg | ORAL_TABLET | Freq: Four times a day (QID) | ORAL | 0 refills | Status: DC | PRN

## 2022-03-17 MED ORDER — ONDANSETRON HCL 4 MG/2ML IJ SOLN
INTRAMUSCULAR | Status: AC
  Filled 2022-03-17: qty 2

## 2022-03-17 MED ORDER — DEXAMETHASONE SODIUM PHOSPHATE 10 MG/ML IJ SOLN
INTRAMUSCULAR | Status: AC
  Filled 2022-03-17: qty 1

## 2022-03-17 SURGICAL SUPPLY — 47 items
ADH SKN CLS APL DERMABOND .7 (GAUZE/BANDAGES/DRESSINGS) ×1
APL PRP STRL LF DISP 70% ISPRP (MISCELLANEOUS) ×1
APPLIER CLIP 9.375 MED OPEN (MISCELLANEOUS)
APR CLP MED 9.3 20 MLT OPN (MISCELLANEOUS)
BINDER BREAST 3XL (GAUZE/BANDAGES/DRESSINGS) IMPLANT
BINDER BREAST LRG (GAUZE/BANDAGES/DRESSINGS) IMPLANT
BINDER BREAST MEDIUM (GAUZE/BANDAGES/DRESSINGS) IMPLANT
BINDER BREAST XLRG (GAUZE/BANDAGES/DRESSINGS) IMPLANT
BINDER BREAST XXLRG (GAUZE/BANDAGES/DRESSINGS) IMPLANT
BLADE SURG 15 STRL LF DISP TIS (BLADE) ×1 IMPLANT
BLADE SURG 15 STRL SS (BLADE) ×1
CANISTER SUC SOCK COL 7IN (MISCELLANEOUS) IMPLANT
CANISTER SUCT 1200ML W/VALVE (MISCELLANEOUS) IMPLANT
CHLORAPREP W/TINT 26 (MISCELLANEOUS) ×1 IMPLANT
CLIP APPLIE 9.375 MED OPEN (MISCELLANEOUS) IMPLANT
COVER BACK TABLE 60X90IN (DRAPES) ×1 IMPLANT
COVER MAYO STAND STRL (DRAPES) ×1 IMPLANT
COVER PROBE CYLINDRICAL 5X96 (MISCELLANEOUS) ×1 IMPLANT
DERMABOND ADVANCED .7 DNX12 (GAUZE/BANDAGES/DRESSINGS) ×1 IMPLANT
DRAPE LAPAROSCOPIC ABDOMINAL (DRAPES) ×1 IMPLANT
DRAPE UTILITY XL STRL (DRAPES) ×1 IMPLANT
ELECT REM PT RETURN 9FT ADLT (ELECTROSURGICAL) ×1
ELECTRODE REM PT RTRN 9FT ADLT (ELECTROSURGICAL) ×1 IMPLANT
GAUZE SPONGE 4X4 12PLY STRL LF (GAUZE/BANDAGES/DRESSINGS) IMPLANT
GLOVE SURG SIGNA 7.5 PF LTX (GLOVE) ×1 IMPLANT
GOWN STRL REUS W/ TWL LRG LVL3 (GOWN DISPOSABLE) ×1 IMPLANT
GOWN STRL REUS W/ TWL XL LVL3 (GOWN DISPOSABLE) ×1 IMPLANT
GOWN STRL REUS W/TWL LRG LVL3 (GOWN DISPOSABLE) ×2
GOWN STRL REUS W/TWL XL LVL3 (GOWN DISPOSABLE) ×1
KIT MARKER MARGIN INK (KITS) ×1 IMPLANT
NDL HYPO 25X1 1.5 SAFETY (NEEDLE) ×1 IMPLANT
NEEDLE HYPO 25X1 1.5 SAFETY (NEEDLE) ×1 IMPLANT
NS IRRIG 1000ML POUR BTL (IV SOLUTION) IMPLANT
PACK BASIN DAY SURGERY FS (CUSTOM PROCEDURE TRAY) ×1 IMPLANT
PENCIL SMOKE EVACUATOR (MISCELLANEOUS) ×1 IMPLANT
SLEEVE SCD COMPRESS KNEE MED (STOCKING) ×1 IMPLANT
SPIKE FLUID TRANSFER (MISCELLANEOUS) IMPLANT
SPONGE T-LAP 4X18 ~~LOC~~+RFID (SPONGE) ×1 IMPLANT
SUT MNCRL AB 4-0 PS2 18 (SUTURE) ×1 IMPLANT
SUT SILK 2 0 SH (SUTURE) IMPLANT
SUT VIC AB 3-0 SH 27 (SUTURE) ×1
SUT VIC AB 3-0 SH 27X BRD (SUTURE) ×1 IMPLANT
SYR CONTROL 10ML LL (SYRINGE) ×1 IMPLANT
TOWEL GREEN STERILE FF (TOWEL DISPOSABLE) ×1 IMPLANT
TRAY FAXITRON CT DISP (TRAY / TRAY PROCEDURE) ×1 IMPLANT
TUBE CONNECTING 20X1/4 (TUBING) IMPLANT
YANKAUER SUCT BULB TIP NO VENT (SUCTIONS) IMPLANT

## 2022-03-17 NOTE — Op Note (Signed)
RIGHT BREAST LUMPECTOMY WITH RADIOACTIVE SEED LOCALIZATION X2  Procedure Note  [name] 03/17/2022   Pre-op Diagnosis: RIGHT BREAST COMPLEX SCLEROSING LESION x 2     Post-op Diagnosis: same  Procedure(s): RIGHT BREAST LUMPECTOMY WITH RADIOACTIVE SEED LOCALIZATION X2  Surgeon(s): Coralie Keens, MD  Anesthesia: General  Staff:  Circulator: Merwyn Katos, RN; Anson Crofts, RN Scrub Person: Lorenza Burton, CST  Estimated Blood Loss: Minimal               Specimens: sent to path  Indications: This is a 57 year old female was found to have 2 separate distortions in her right breast on screening mammography.  Both areas were in the upper outer quadrant of the right breast.  1 was closest to the areola and 1 was closer to the axilla.  Both were biopsied showing complex sclerosing lesions.  The decision was made to proceed with a right breast lumpectomy x 2  Findings: The first specimen contained the radioactive seed and previous biopsy clip was labeled as right breast close to the nipple/areola.  With dissection of the second specimen, the radioactive seed came out of the specimen.  It was x-rayed separately.  I removed several pieces of tissue in the upper outer quadrant but could not identify the biopsy clip.  All this tissue was sent as breast tissue close to the axilla of the right breast  Procedure: The patient was brought to the operating identified the correct patient.  She was placed upon the operating table and general anesthesia was induced.  Her right breast was then prepped and draped in usual sterile fashion.  Using the neoprobe I located 2 seeds in the right breast in the upper outer quadrant.  1 was close to the nipple areolar complex and the other was close to the axilla.  I elected to make 1 incision in between the 2 areas for the lumpectomy.  I anesthetized skin with Marcaine and made incision with a scalpel.  I then dissected inferiorly toward the radioactive  seed near the nipple.  With the aid of neoprobe I was able to identify it deep in the breast tissue.  I performed a lumpectomy staying around the radioactive seed with a the neoprobe.  Once I completed the lumpectomy an x-ray was performed on the specimen confirming that the radioactive seed and previous tissue marker in the specimen.  This was sent to pathology as the right breast outer quadrant close to the nipple.  Using neoprobe I then dissected laterally toward the axilla and dissected radioactive seed which is in the more superficial tissue.  This tissue was much softer and the radioactive seed became displaced from the breast tissue.  I placed the seed in a cup and it was x-rayed separately.  I completed the lumpectomy and marked the margins with paint.  Unfortunately, the previous biopsy clip was not in the specimen.  I took tissue down to the chest wall and medial and lateral to the area.  All this tissue was x-rayed and again no biopsy clip was identified.  Palpated the remaining breast cavity and could find no biopsy clip and no suspicious filling tissue so decision was made to terminate the procedure at this point.  I achieved hemostasis with the cautery.  I anesthetized the incision further with Marcaine.  Then closed the subcutaneous tissue with interrupted 3-0 Vicryl sutures and closed the skin with a running 4-0 Monocryl.  Dermabond was then applied.  The patient tolerated the procedure  well.  All the counts were correct at the end of the procedure.  The patient was then extubated in the operating room and taken in a stable condition to the recovery room.          Coralie Keens   Date: 03/17/2022  Time: 8:11 AM

## 2022-03-17 NOTE — Interval H&P Note (Signed)
History and Physical Interval Note: no change in H and P  03/17/2022 7:03 AM  [name]  has presented today for surgery, with the diagnosis of RIGHT BREAST COMPLEX SCLEROSING LESION.  The various methods of treatment have been discussed with the patient and family. After consideration of risks, benefits and other options for treatment, the patient has consented to  Procedure(s): RIGHT BREAST LUMPECTOMY WITH RADIOACTIVE SEED LOCALIZATION X2 (Right) as a surgical intervention.  The patient's history has been reviewed, patient examined, no change in status, stable for surgery.  I have reviewed the patient's chart and labs.  Questions were answered to the patient's satisfaction.     Coralie Keens

## 2022-03-17 NOTE — Anesthesia Preprocedure Evaluation (Addendum)
Anesthesia Evaluation  Patient identified by MRN, date of birth, ID band Patient awake    Reviewed: Allergy & Precautions, NPO status , Patient's Chart, lab work & pertinent test results  Airway Mallampati: I  TM Distance: >3 FB Neck ROM: Full    Dental no notable dental hx. (+) Teeth Intact, Dental Advisory Given   Pulmonary neg pulmonary ROS   Pulmonary exam normal breath sounds clear to auscultation       Cardiovascular Normal cardiovascular exam Rhythm:Regular Rate:Normal  HLD   Neuro/Psych negative neurological ROS  negative psych ROS   GI/Hepatic negative GI ROS, Neg liver ROS,,,  Endo/Other  negative endocrine ROS    Renal/GU negative Renal ROS  negative genitourinary   Musculoskeletal  (+) Arthritis ,    Abdominal   Peds  Hematology negative hematology ROS (+)   Anesthesia Other Findings   Reproductive/Obstetrics                             Anesthesia Physical Anesthesia Plan  ASA: 2  Anesthesia Plan: General   Post-op Pain Management: Tylenol PO (pre-op)*   Induction: Intravenous  PONV Risk Score and Plan: 3 and Ondansetron, Dexamethasone and Midazolam  Airway Management Planned: LMA  Additional Equipment:   Intra-op Plan:   Post-operative Plan: Extubation in OR  Informed Consent: I have reviewed the patients History and Physical, chart, labs and discussed the procedure including the risks, benefits and alternatives for the proposed anesthesia with the patient or authorized representative who has indicated his/her understanding and acceptance.     Dental advisory given  Plan Discussed with: CRNA  Anesthesia Plan Comments:        Anesthesia Quick Evaluation

## 2022-03-17 NOTE — Anesthesia Procedure Notes (Signed)
Procedure Name: LMA Insertion Date/Time: 03/17/2022 7:24 AM  Performed by: Lavonia Dana, CRNAPre-anesthesia Checklist: Patient identified, Emergency Drugs available, Suction available and Patient being monitored Patient Re-evaluated:Patient Re-evaluated prior to induction Oxygen Delivery Method: Circle system utilized Preoxygenation: Pre-oxygenation with 100% oxygen Induction Type: IV induction Ventilation: Mask ventilation without difficulty LMA: LMA inserted LMA Size: 4.0 Number of attempts: 1 Airway Equipment and Method: Bite block Placement Confirmation: positive ETCO2 Tube secured with: Tape Dental Injury: Teeth and Oropharynx as per pre-operative assessment

## 2022-03-17 NOTE — Discharge Instructions (Addendum)
[name] Office Phone Number [phone]  BREAST BIOPSY/ PARTIAL MASTECTOMY: POST OP INSTRUCTIONS  Always review your discharge instruction sheet given to you by the facility where your surgery was performed.  IF YOU HAVE DISABILITY OR FAMILY LEAVE FORMS, YOU MUST BRING THEM TO THE OFFICE FOR PROCESSING.  DO NOT GIVE THEM TO YOUR DOCTOR.  A prescription for pain medication may be given to you upon discharge.  Take your pain medication as prescribed, if needed.  If narcotic pain medicine is not needed, then you may take acetaminophen (Tylenol) or ibuprofen (Advil) as needed. Take your usually prescribed medications unless otherwise directed If you need a refill on your pain medication, please contact your pharmacy.  They will contact our office to request authorization.  Prescriptions will not be filled after 5pm or on week-ends. You should eat very light the first 24 hours after surgery, such as soup, crackers, pudding, etc.  Resume your normal diet the day after surgery. Most patients will experience some swelling and bruising in the breast.  Ice packs and a good support bra will help.  Swelling and bruising can take several days to resolve.  It is common to experience some constipation if taking pain medication after surgery.  Increasing fluid intake and taking a stool softener will usually help or prevent this problem from occurring.  A mild laxative (Milk of Magnesia or Miralax) should be taken according to package directions if there are no bowel movements after 48 hours. Unless discharge instructions indicate otherwise, you may remove your bandages 24-48 hours after surgery, and you may shower at that time.  You may have steri-strips (small skin tapes) in place directly over the incision.  These strips should be left on the skin for 7-10 days.  If your surgeon used skin glue on the incision, you may shower in 24 hours.  The glue will flake off over the next 2-3 weeks.  Any  sutures or staples will be removed at the office during your follow-up visit. ACTIVITIES:  You may resume regular daily activities (gradually increasing) beginning the next day.  Wearing a good support bra or sports bra minimizes pain and swelling.  You may have sexual intercourse when it is comfortable. You may drive when you no longer are taking prescription pain medication, you can comfortably wear a seatbelt, and you can safely maneuver your car and apply brakes. RETURN TO WORK:  ______________________________________________________________________________________ [name] should see your doctor in the office for a follow-up appointment approximately two weeks after your surgery.  Your doctor's nurse will typically make your follow-up appointment when she calls you with your pathology report.  Expect your pathology report 2-3 business days after your surgery.  You may call to check if you do not hear from [place] after three days. OTHER INSTRUCTIONS: YOU MAY REMOVE THE BINDER AND SHOWER STARTING TOMORROW.  THEN WEAR WHAT EVER MAKES YOU THE MOST COMFORTABLE ICE PACK, TYLENOL, AND IBUPROFEN ALSO FOR PAIN NO VIGOROUS ACTIVITY FOR ONE WEEK _______________________________________________________________________________________________ _____________________________________________________________________________________________________________________________________ _____________________________________________________________________________________________________________________________________ _____________________________________________________________________________________________________________________________________  WHEN TO CALL YOUR DOCTOR: Fever over 101.0 Nausea and/or vomiting. Extreme swelling or bruising. Continued bleeding from incision. Increased pain, redness, or drainage from the incision.  The clinic staff is available to answer your questions during regular business hours.  Please  don't hesitate to call and ask to speak to one of the nurses for clinical concerns.  If you have a medical emergency, go to the nearest emergency room or call 911.  A Psychologist, sport and exercise from [facility] is always on call at the hospital.  For further questions, please visit [email or web address]       Post Anesthesia Home Care Instructions  Activity: Get plenty of rest for the remainder of the day. A responsible individual must stay with you for 24 hours following the procedure.  For the next 24 hours, DO NOT: -Drive a car -Paediatric nurse -Drink alcoholic beverages -Take any medication unless instructed by your physician -Make any legal decisions or sign important papers.  Meals: Start with liquid foods such as gelatin or soup. Progress to regular foods as tolerated. Avoid greasy, spicy, heavy foods. If nausea and/or vomiting occur, drink only clear liquids until the nausea and/or vomiting subsides. Call your physician if vomiting continues.  Special Instructions/Symptoms: Your throat may feel dry or sore from the anesthesia or the breathing tube placed in your throat during surgery. If this causes discomfort, gargle with warm salt water. The discomfort should disappear within 24 hours.  If you had a scopolamine patch placed behind your ear for the management of post- operative nausea and/or vomiting:  1. The medication in the patch is effective for 72 hours, after which it should be removed.  Wrap patch in a tissue and discard in the trash. Wash hands thoroughly with soap and water. 2. You may remove the patch earlier than 72 hours if you experience unpleasant side effects which may include dry mouth, dizziness or visual disturbances. 3. Avoid touching the patch. Wash your hands with soap and water after contact with the patch.  You may have Tylenol again after 1230pm today, if needed.

## 2022-03-17 NOTE — Transfer of Care (Signed)
Immediate Anesthesia Transfer of Care Note  Patient: YUVIA PLANT  Procedure(s) Performed: RIGHT BREAST LUMPECTOMY WITH RADIOACTIVE SEED LOCALIZATION X2 (Right: Breast)  Patient Location: PACU  Anesthesia Type:General  Level of Consciousness: drowsy  Airway & Oxygen Therapy: Patient Spontanous Breathing and Patient connected to face mask oxygen  Post-op Assessment: Report given to RN and Post -op Vital signs reviewed and stable  Post vital signs: Reviewed and stable  Last Vitals:  Vitals Value Taken Time  BP 126/79 (93)   Temp    Pulse 98   Resp 14   SpO2 100%     Last Pain:  Vitals:   03/17/22 0628  TempSrc: Oral  PainSc: 0-No pain      Patients Stated Pain Goal: 8 (10/30/28 0762)  Complications: No notable events documented.

## 2022-03-17 NOTE — Anesthesia Postprocedure Evaluation (Signed)
Anesthesia Post Note  Patient: [name]  Procedure(s) Performed: RIGHT BREAST LUMPECTOMY WITH RADIOACTIVE SEED LOCALIZATION X2 (Right: Breast)     Patient location during evaluation: PACU Anesthesia Type: General Level of consciousness: awake and alert Pain management: pain level controlled Vital Signs Assessment: post-procedure vital signs reviewed and stable Respiratory status: spontaneous breathing, nonlabored ventilation, respiratory function stable and patient connected to nasal cannula oxygen Cardiovascular status: blood pressure returned to baseline and stable Postop Assessment: no apparent nausea or vomiting Anesthetic complications: no  No notable events documented.  Last Vitals:  Vitals:   03/17/22 0915 03/17/22 0951  BP: 136/74 (!) 159/86  Pulse: 88 77  Resp: 10 18  Temp:  (!) 36.2 C  SpO2: 98% 99%    Last Pain:  Vitals:   03/17/22 0936  TempSrc:   PainSc: 4                  Martha Ellerby L Nica Friske

## 2022-03-18 LAB — SURGICAL PATHOLOGY

## 2022-03-29 ENCOUNTER — Encounter (HOSPITAL_COMMUNITY): Payer: Self-pay

## 2022-04-27 ENCOUNTER — Other Ambulatory Visit (HOSPITAL_COMMUNITY): Payer: Self-pay | Admitting: Orthopedic Surgery

## 2022-05-12 ENCOUNTER — Other Ambulatory Visit: Payer: Self-pay

## 2022-05-12 ENCOUNTER — Encounter (HOSPITAL_BASED_OUTPATIENT_CLINIC_OR_DEPARTMENT_OTHER): Payer: Self-pay | Admitting: Orthopedic Surgery

## 2022-05-12 NOTE — Progress Notes (Signed)
   05/12/22 1214  Pre-op Phone Call  Surgery Date Verified 05/19/22  Arrival Time Verified (S)  0930 (instructions emailed to pt (who wasn't able to write at time of call))  Surgery Location Verified [facility] [place]  Medical History Reviewed Yes  Is the patient taking a GLP-1 receptor agonist? No  Does the patient have diabetes? No diagnosis of diabetes  Do you have a history of heart problems? No  Antiarrhythmic device type  (NA)  Does patient have other implanted devices? No  Patient Teaching Enhanced Recovery  Patient educated about smoking cessation 24 hours prior to surgery. N/A Non-Smoker  Patient verbalizes understanding of bowel prep? N/A  Med Rec Completed Yes  Take the Following Meds the Morning of Surgery no meds AM of sx  Recent  Lab Work, EKG, CXR? No  NPO (Including gum & candy) After midnight  Patient instructed to stop clear liquids including Carb loading drink at: 0730  Stop Solids, Milk, Candy, and Gum STARTING AT MIDNIGHT  Did patient view EMMI videos? Yes  Responsible adult to drive and be with you for 24 hours? Yes  Name & Phone Number for Ride/Caregiver husband, Zenia Resides  No Jewelry, money, nail polish or make-up.  No lotions, powders, perfumes. No shaving  48 hrs. prior to surgery. Yes  Contacts, Dentures & Glasses Will Have to be Removed Before OR. Yes  Please bring your ID and Insurance Card the morning of your surgery. (Surgery Centers Only) Yes  Bring any papers or x-rays with you that your surgeon gave you. Yes  Instructed to contact the location of procedure/ provider if they or anyone in their household develops symptoms or tests positive for COVID-19, has close contact with someone who tests positive for COVID, or has known exposure to any contagious illness. Yes  Call this number the morning of surgery  with any problems that may cancel your surgery. [phone]  Covid-19 Assessment  Have you had a positive COVID-19 test within the previous 90 days? No  COVID  Testing Guidance Proceed with the additional questions.  Patient's surgery required a COVID-19 test (cardiothoracic, complex ENT, and bronchoscopies/ EBUS) No  Have you been unmasked and in close contact with anyone with COVID-19 or COVID-19 symptoms within the past 10 days? No  Do you or anyone in your household currently have any COVID-19 symptoms? No

## 2022-05-19 ENCOUNTER — Ambulatory Visit (HOSPITAL_BASED_OUTPATIENT_CLINIC_OR_DEPARTMENT_OTHER): Payer: BC Managed Care – PPO | Admitting: Certified Registered"

## 2022-05-19 ENCOUNTER — Ambulatory Visit (HOSPITAL_BASED_OUTPATIENT_CLINIC_OR_DEPARTMENT_OTHER): Payer: BC Managed Care – PPO

## 2022-05-19 ENCOUNTER — Other Ambulatory Visit: Payer: Self-pay

## 2022-05-19 ENCOUNTER — Encounter (HOSPITAL_BASED_OUTPATIENT_CLINIC_OR_DEPARTMENT_OTHER)
Admission: RE | Disposition: A | Discharge status: Home / Self Care | Payer: Self-pay | Source: Home / Self Care | Attending: Orthopedic Surgery

## 2022-05-19 ENCOUNTER — Ambulatory Visit (HOSPITAL_BASED_OUTPATIENT_CLINIC_OR_DEPARTMENT_OTHER)
Admission: RE | Admit: 2022-05-19 | Discharge: 2022-05-19 | Disposition: A | Discharge status: Home / Self Care | Payer: BC Managed Care – PPO | Attending: Orthopedic Surgery | Admitting: Orthopedic Surgery

## 2022-05-19 ENCOUNTER — Encounter (HOSPITAL_BASED_OUTPATIENT_CLINIC_OR_DEPARTMENT_OTHER): Payer: Self-pay | Admitting: Orthopedic Surgery

## 2022-05-19 DIAGNOSIS — M21611 Bunion of right foot: Secondary | ICD-10-CM | POA: Insufficient documentation

## 2022-05-19 DIAGNOSIS — M2021 Hallux rigidus, right foot: Secondary | ICD-10-CM | POA: Diagnosis not present

## 2022-05-19 HISTORY — PX: WEIL OSTEOTOMY: SHX5044

## 2022-05-19 HISTORY — PX: ARTHRODESIS METATARSALPHALANGEAL JOINT (MTPJ): SHX6566

## 2022-05-19 SURGERY — FUSION, JOINT, GREAT TOE
Anesthesia: General | Site: Toe | Laterality: Right

## 2022-05-19 MED ORDER — VANCOMYCIN HCL 500 MG IV SOLR
INTRAVENOUS | Status: DC | PRN
  Administered 2022-05-19: 500 mg via TOPICAL

## 2022-05-19 MED ORDER — OXYCODONE HCL 5 MG/5ML PO SOLN: 5.0000 mg | Freq: Once | ORAL | Status: DC | PRN

## 2022-05-19 MED ORDER — ONDANSETRON HCL 4 MG/2ML IJ SOLN: 4.0000 mg | Freq: Four times a day (QID) | INTRAMUSCULAR | Status: DC | PRN

## 2022-05-19 MED ORDER — CEFAZOLIN SODIUM-DEXTROSE 2-4 GM/100ML-% IV SOLN
2.0000 g | INTRAVENOUS | Status: AC
  Administered 2022-05-19: 2 g via INTRAVENOUS

## 2022-05-19 MED ORDER — EPHEDRINE 5 MG/ML INJ
INTRAVENOUS | Status: AC
  Filled 2022-05-19: qty 5

## 2022-05-19 MED ORDER — OXYCODONE HCL 5 MG PO TABS
5.0000 mg | ORAL_TABLET | Freq: Four times a day (QID) | ORAL | 0 refills | Status: AC | PRN
Start: 2022-05-19 — End: 2022-05-24

## 2022-05-19 MED ORDER — DEXAMETHASONE SODIUM PHOSPHATE 10 MG/ML IJ SOLN
INTRAMUSCULAR | Status: DC | PRN
  Administered 2022-05-19: 5 mg via INTRAVENOUS

## 2022-05-19 MED ORDER — FENTANYL CITRATE (PF) 100 MCG/2ML IJ SOLN
INTRAMUSCULAR | Status: AC
  Filled 2022-05-19: qty 2

## 2022-05-19 MED ORDER — SODIUM CHLORIDE 0.9 % IV SOLN: INTRAVENOUS | Status: DC

## 2022-05-19 MED ORDER — ONDANSETRON HCL 4 MG/2ML IJ SOLN
INTRAMUSCULAR | Status: DC | PRN
  Administered 2022-05-19: 4 mg via INTRAVENOUS

## 2022-05-19 MED ORDER — 0.9 % SODIUM CHLORIDE (POUR BTL) OPTIME
TOPICAL | Status: DC | PRN
  Administered 2022-05-19: 200 mL

## 2022-05-19 MED ORDER — MIDAZOLAM HCL 2 MG/2ML IJ SOLN
1.0000 mg | Freq: Once | INTRAMUSCULAR | Status: AC
  Administered 2022-05-19: 1 mg via INTRAVENOUS

## 2022-05-19 MED ORDER — EPHEDRINE SULFATE (PRESSORS) 50 MG/ML IJ SOLN
INTRAMUSCULAR | Status: DC | PRN
  Administered 2022-05-19 (×4): 5 mg via INTRAVENOUS

## 2022-05-19 MED ORDER — FENTANYL CITRATE (PF) 100 MCG/2ML IJ SOLN
50.0000 ug | Freq: Once | INTRAMUSCULAR | Status: AC
  Administered 2022-05-19: 50 ug via INTRAVENOUS

## 2022-05-19 MED ORDER — LACTATED RINGERS IV SOLN: INTRAVENOUS | Status: DC

## 2022-05-19 MED ORDER — PROPOFOL 10 MG/ML IV BOLUS
INTRAVENOUS | Status: DC | PRN
  Administered 2022-05-19: 200 mg via INTRAVENOUS

## 2022-05-19 MED ORDER — CEFAZOLIN SODIUM-DEXTROSE 2-4 GM/100ML-% IV SOLN
INTRAVENOUS | Status: AC
  Filled 2022-05-19: qty 100

## 2022-05-19 MED ORDER — FENTANYL CITRATE (PF) 100 MCG/2ML IJ SOLN: 25.0000 ug | INTRAMUSCULAR | Status: DC | PRN

## 2022-05-19 MED ORDER — OXYCODONE HCL 5 MG PO TABS: 5.0000 mg | ORAL_TABLET | Freq: Once | ORAL | Status: DC | PRN

## 2022-05-19 MED ORDER — ROPIVACAINE HCL 5 MG/ML IJ SOLN
INTRAMUSCULAR | Status: DC | PRN
  Administered 2022-05-19: 30 mL via PERINEURAL

## 2022-05-19 MED ORDER — MIDAZOLAM HCL 2 MG/2ML IJ SOLN
INTRAMUSCULAR | Status: AC
  Filled 2022-05-19: qty 2

## 2022-05-19 MED ORDER — LIDOCAINE HCL (CARDIAC) PF 100 MG/5ML IV SOSY
PREFILLED_SYRINGE | INTRAVENOUS | Status: DC | PRN
  Administered 2022-05-19: 20 mg via INTRAVENOUS

## 2022-05-19 SURGICAL SUPPLY — 91 items
APL PRP STRL LF DISP 70% ISPRP (MISCELLANEOUS) ×1
BANDAGE ESMARK 6X9 LF (GAUZE/BANDAGES/DRESSINGS) IMPLANT
BIT DRILL 2.7XCANN QCK CNCT (BIT) IMPLANT
BIT DRILL CANN 2.7 (BIT) ×1
BIT DRILL Q-C 2.0 DIA 100 (BIT) IMPLANT
BIT DRL 2.7XCANN QCK CNCT (BIT) ×1
BLADE AVERAGE 25X9 (BLADE) IMPLANT
BLADE LONG MED 25X9 (BLADE) ×1 IMPLANT
BLADE MICRO SAGITTAL (BLADE) IMPLANT
BLADE OSC/SAG .038X5.5 CUT EDG (BLADE) IMPLANT
BLADE SURG 15 STRL LF DISP TIS (BLADE) ×2 IMPLANT
BLADE SURG 15 STRL SS (BLADE) ×2
BNDG CMPR 5X62 HK CLSR LF (GAUZE/BANDAGES/DRESSINGS)
BNDG CMPR 6"X 5 YARDS HK CLSR (GAUZE/BANDAGES/DRESSINGS)
BNDG CMPR 75X21 PLY HI ABS (MISCELLANEOUS)
BNDG CMPR 9X4 STRL LF SNTH (GAUZE/BANDAGES/DRESSINGS)
BNDG CMPR 9X6 STRL LF SNTH (GAUZE/BANDAGES/DRESSINGS)
BNDG ELASTIC 4X5.8 VLCR STR LF (GAUZE/BANDAGES/DRESSINGS) ×1 IMPLANT
BNDG ELASTIC 6INX 5YD STR LF (GAUZE/BANDAGES/DRESSINGS) IMPLANT
BNDG ESMARK 4X9 LF (GAUZE/BANDAGES/DRESSINGS) IMPLANT
BNDG ESMARK 6X9 LF (GAUZE/BANDAGES/DRESSINGS)
BNDG GZE 12X3 1 PLY HI ABS (GAUZE/BANDAGES/DRESSINGS) ×1
BNDG STRETCH GAUZE 3IN X12FT (GAUZE/BANDAGES/DRESSINGS) ×1 IMPLANT
BOOT STEPPER DURA LG (SOFTGOODS) IMPLANT
BOOT STEPPER DURA MED (SOFTGOODS) IMPLANT
BOOT STEPPER DURA SM (SOFTGOODS) IMPLANT
BOOT STEPPER DURA XLG (SOFTGOODS) IMPLANT
CHLORAPREP W/TINT 26 (MISCELLANEOUS) ×1 IMPLANT
COVER BACK TABLE 60X90IN (DRAPES) ×1 IMPLANT
CUFF TOURN SGL QUICK 24 (TOURNIQUET CUFF)
CUFF TOURN SGL QUICK 34 (TOURNIQUET CUFF)
CUFF TRNQT CYL 24X4X16.5-23 (TOURNIQUET CUFF) IMPLANT
CUFF TRNQT CYL 34X4.125X (TOURNIQUET CUFF) IMPLANT
DRAPE EXTREMITY T 121X128X90 (DISPOSABLE) ×1 IMPLANT
DRAPE OEC MINIVIEW 54X84 (DRAPES) ×1 IMPLANT
DRAPE U-SHAPE 47X51 STRL (DRAPES) ×1 IMPLANT
DRSG MEPITEL 4X7.2 (GAUZE/BANDAGES/DRESSINGS) ×1 IMPLANT
ELECT REM PT RETURN 9FT ADLT (ELECTROSURGICAL) ×1
ELECTRODE REM PT RTRN 9FT ADLT (ELECTROSURGICAL) ×1 IMPLANT
GAUZE PAD ABD 8X10 STRL (GAUZE/BANDAGES/DRESSINGS) ×1 IMPLANT
GAUZE SPONGE 4X4 12PLY STRL (GAUZE/BANDAGES/DRESSINGS) ×1 IMPLANT
GAUZE STRETCH 2X75IN STRL (MISCELLANEOUS) IMPLANT
GLOVE BIO SURGEON STRL SZ8 (GLOVE) ×1 IMPLANT
GLOVE BIOGEL PI IND STRL 7.0 (GLOVE) IMPLANT
GLOVE BIOGEL PI IND STRL 8 (GLOVE) ×2 IMPLANT
GLOVE SURG SS PI 7.0 STRL IVOR (GLOVE) IMPLANT
GOWN STRL REUS W/ TWL LRG LVL3 (GOWN DISPOSABLE) ×1 IMPLANT
GOWN STRL REUS W/ TWL XL LVL3 (GOWN DISPOSABLE) ×2 IMPLANT
GOWN STRL REUS W/TWL LRG LVL3 (GOWN DISPOSABLE) ×1
GOWN STRL REUS W/TWL XL LVL3 (GOWN DISPOSABLE) ×1
K-WIRE PT THD 1.6X150 (WIRE) ×1
KWIRE PT THD 1.6X150 (WIRE) IMPLANT
NDL HYPO 22X1.5 SAFETY MO (MISCELLANEOUS) IMPLANT
NEEDLE HYPO 22X1.5 SAFETY MO (MISCELLANEOUS) IMPLANT
NS IRRIG 1000ML POUR BTL (IV SOLUTION) ×1 IMPLANT
PACK BASIN DAY SURGERY FS (CUSTOM PROCEDURE TRAY) ×1 IMPLANT
PAD CAST 4YDX4 CTTN HI CHSV (CAST SUPPLIES) ×1 IMPLANT
PADDING CAST COTTON 4X4 STRL (CAST SUPPLIES) ×1
PADDING CAST COTTON 6X4 STRL (CAST SUPPLIES) IMPLANT
PENCIL SMOKE EVACUATOR (MISCELLANEOUS) ×1 IMPLANT
PLATE TUB 39 W/COLLAR 5H (Plate) IMPLANT
SANITIZER HAND PURELL FF 515ML (MISCELLANEOUS) ×1 IMPLANT
SCREW 2.7X16MM (Screw) ×1 IMPLANT
SCREW CANN 4.0X32 BIODUR 1/3 (Screw) IMPLANT
SCREW CORT 2.5X20X2.7XST SM (Screw) IMPLANT
SCREW CORTICAL 2.7X14MM (Screw) IMPLANT
SCREW CORTICAL 2.7X18MM (Screw) IMPLANT
SCREW CORTICAL 2.7X20MM (Screw) ×1 IMPLANT
SCREW HCS TWIST-OFF 2.0X12MM (Screw) IMPLANT
SCREW NLOCK CORT 2.7X16 NS (Screw) IMPLANT
SHEET MEDIUM DRAPE 40X70 STRL (DRAPES) ×1 IMPLANT
SLEEVE SCD COMPRESS KNEE MED (STOCKING) ×1 IMPLANT
SPLINT PLASTER CAST FAST 5X30 (CAST SUPPLIES) IMPLANT
SPONGE SURGIFOAM ABS GEL 12-7 (HEMOSTASIS) IMPLANT
SPONGE T-LAP 18X18 ~~LOC~~+RFID (SPONGE) ×1 IMPLANT
STOCKINETTE 6  STRL (DRAPES) ×1
STOCKINETTE 6 STRL (DRAPES) ×1 IMPLANT
SUCTION FRAZIER HANDLE 10FR (MISCELLANEOUS) ×1
SUCTION TUBE FRAZIER 10FR DISP (MISCELLANEOUS) ×1 IMPLANT
SUT ETHILON 3 0 PS 1 (SUTURE) ×1 IMPLANT
SUT MNCRL AB 3-0 PS2 18 (SUTURE) ×1 IMPLANT
SUT VIC AB 2-0 SH 27 (SUTURE) ×1
SUT VIC AB 2-0 SH 27XBRD (SUTURE) ×1 IMPLANT
SUT VICRYL 0 SH 27 (SUTURE) IMPLANT
SUT VICRYL 0 UR6 27IN ABS (SUTURE) IMPLANT
SYR BULB EAR ULCER 3OZ GRN STR (SYRINGE) ×1 IMPLANT
SYR CONTROL 10ML LL (SYRINGE) IMPLANT
TOWEL GREEN STERILE FF (TOWEL DISPOSABLE) ×2 IMPLANT
TUBE CONNECTING 20X1/4 (TUBING) ×1 IMPLANT
UNDERPAD 30X36 HEAVY ABSORB (UNDERPADS AND DIAPERS) ×1 IMPLANT
YANKAUER SUCT BULB TIP NO VENT (SUCTIONS) IMPLANT

## 2022-05-19 NOTE — Anesthesia Procedure Notes (Signed)
Procedure Name: LMA Insertion Date/Time: 05/19/2022 12:33 PM  Performed by: Lauralyn Primes, CRNAPre-anesthesia Checklist: Patient identified, Emergency Drugs available, Suction available and Patient being monitored Patient Re-evaluated:Patient Re-evaluated prior to induction Oxygen Delivery Method: Circle system utilized Preoxygenation: Pre-oxygenation with 100% oxygen Induction Type: IV induction Ventilation: Mask ventilation without difficulty LMA: LMA inserted LMA Size: 4.0 Number of attempts: 1 Airway Equipment and Method: Bite block Placement Confirmation: positive ETCO2 Tube secured with: Tape Dental Injury: Teeth and Oropharynx as per pre-operative assessment

## 2022-05-19 NOTE — Op Note (Signed)
05/19/2022  1:36 PM  PATIENT:  [name]  57 y.o. female  PRE-OPERATIVE DIAGNOSIS: 1.  Right foot painful bunion deformity 2.  Right hallux rigidus 3.  Right forefoot metatarsalgia  POST-OPERATIVE DIAGNOSIS: Same  Procedure(s): 1.  Right hallux MP joint arthrodesis 2.  Right first metatarsal Silver bunionectomy 3.  Right second metatarsal Weil osteotomy 4.  Right foot AP and lateral radiographs  SURGEON:  Toni Arthurs, MD  ASSISTANT: None  ANESTHESIA:   General, regional  EBL:  minimal   TOURNIQUET:   Total Tourniquet Time Documented: Thigh (Right) - 35 minutes Total: Thigh (Right) - 35 minutes  COMPLICATIONS:  None apparent  DISPOSITION:  Extubated, awake and stable to recovery.  INDICATION FOR PROCEDURE: 57 year old female without significant past medical history complains of worsening pain at the right forefoot due to a prominent bunion deformity and hallux rigidus.  She also has signs and symptoms of metatarsalgia.  She has failed nonoperative treatment to date including activity modification, oral anti-inflammatories and shoewear modification.  She presents today for correction of these painful forefoot deformities.  The risks and benefits of the alternative treatment options have been discussed in detail.  The patient wishes to proceed with surgery and specifically understands risks of bleeding, infection, nerve damage, blood clots, need for additional surgery, amputation and death.   PROCEDURE IN DETAIL:  After pre operative consent was obtained, and the correct operative site was identified, the patient was brought to the operating room and placed supine on the OR table.  Anesthesia was administered.  Pre-operative antibiotics were administered.  A surgical timeout was taken.  The right lower extremity was prepped and draped in standard sterile fashion with a tourniquet around the thigh.  The extremity was elevated, and tourniquet was inflated to 250 mmHg.  A  longitudinal incision was made over the hallux MP joint.  Dissection was carried down through the subcutaneous tissues.  The extensor tendons were protected.  The dorsal joint capsule was incised and elevated medially and laterally.  The collateral ligaments were released exposing the metatarsal head.  The hypertrophic medial eminence was resected with a rondure.  Peripheral osteophytes were removed with a rondure as well.  The remaining articular cartilage and subchondral bone from the metatarsal head was resected with a concave reamer.  The remaining articular cartilage and subchondral bone was removed from the base of the proximal phalanx with a convex reamer.  The wound was irrigated copiously.  A small drill bit was used to perforate both sides of the joint leaving the resultant bone graft in place.  The joint was reduced and provisionally pinned.  AP and lateral radiographs confirmed appropriate alignment of the joint and correction of the hallux valgus and intermetatarsal angles.  Related weightbearing examination showed appropriate dorsiflexion of the toe.  The guidewire was then overdrilled.  A 4 mm partially-threaded stainless steel cannulated screw was inserted.  It was noted to have excellent purchase and compressed the arthrodesis site appropriately.  5 hole one quarter tubular plate from the [employer] mini frag set was then placed over the dorsum of the joint.  It was secured proximally and distally with 2 bicortical screws on each side of the joint.  AP and lateral radiographs confirmed appropriate position of the joint and appropriate position and length of all hardware.  The wound was irrigated copiously and sprinkled with vancomycin powder.  Deep subcutaneous tissues were approximated with 2-0 Vicryl.  The skin incision was closed with running 3-0 nylon.  Attention was turned to the second ray where a longitudinal incision was made over the MTP joint.  Dissection was carried sharply down  through the subcutaneous tissues.  The dorsal joint capsule was incised.  The metatarsal head was exposed.  A Weil osteotomy was made with the oscillating saw removing the small wedge of bone distally.  The head was allowed to retract proximally several millimeters.  The osteotomy was fixed with a [employer] FRS screw.  Overhanging bone was trimmed with a rondure.  Final AP and lateral radiographs showed appropriate shortening of the second metatarsal and interval arthrodesis and bunion correction of the first MTP joint.  Hardware is appropriately positioned and of the appropriate lengths.  The dorsal wound was irrigated copiously and sprinkled with vancomycin powder.  The skin incision was closed with horizontal mattress sutures of 3-0 nylon.  Sterile dressings were applied followed by a compression wrap and a cam boot.  The tourniquet was released after application of the dressings.  The patient was awakened from anesthesia and transported to the recovery room in stable condition.   FOLLOW UP PLAN: Weightbearing as tolerated on the heel in a cam boot.  Follow-up in the office in 2 weeks for suture removal.  No indication for DVT prophylaxis in this ambulatory patient.   RADIOGRAPHS: AP and lateral radiographs of the right foot are obtained intraoperatively.  These show interval correction of the bunion deformity and shortening of the second metatarsal as well as arthrodesis of the hallux MP joint.  Hardware is appropriately positioned and of the appropriate lengths.  No other acute injuries are noted.

## 2022-05-19 NOTE — Discharge Instructions (Addendum)
Toni Arthurs, MD [facility]  Please read the following information regarding your care after surgery.  Medications  You only need a prescription for the narcotic pain medicine (ex. oxycodone, Percocet, Norco).  All of the other medicines listed below are available over the counter. X  Aleve 2 pills twice a day for the first 3 days after surgery. X  acetominophen (Tylenol) 650 mg every 4-6 hours as you need for minor to moderate pain X  oxycodone as prescribed for severe pain  Narcotic pain medicine (ex. oxycodone, Percocet, Vicodin) will cause constipation.  To prevent this problem, take the following medicines while you are taking any pain medicine. X docusate sodium (Colace) 100 mg twice a day X senna (Senokot) 2 tablets twice a day  Weight Bearing X Bear weight only on your heel in the cam boot.  Cast / Splint / Dressing X Keep your splint, cast or dressing clean and dry.  Don't put anything (coat hanger, pencil, etc) down inside of it.  If it gets damp, use a hair dryer on the cool setting to dry it.  If it gets soaked, call the office to schedule an appointment for a cast change.  After your dressing, cast or splint is removed; you may shower, but do not soak or scrub the wound.  Allow the water to run over it, and then gently pat it dry.  Swelling It is normal for you to have swelling where you had surgery.  To reduce swelling and pain, keep your toes above your nose for at least 3 days after surgery.  It may be necessary to keep your foot or leg elevated for several weeks.  If it hurts, it should be elevated.  Follow Up Call my office at [phone] when you are discharged from the hospital or surgery center to schedule an appointment to be seen two weeks after surgery.  Call my office at [phone] if you develop a fever >101.5 F, nausea, vomiting, bleeding from the surgical site or severe pain.       Post Anesthesia Home Care Instructions  Activity: Get plenty of rest  for the remainder of the day. A responsible individual must stay with you for 24 hours following the procedure.  For the next 24 hours, DO NOT: -Drive a car -Advertising copywriter -Drink alcoholic beverages -Take any medication unless instructed by your physician -Make any legal decisions or sign important papers.  Meals: Start with liquid foods such as gelatin or soup. Progress to regular foods as tolerated. Avoid greasy, spicy, heavy foods. If nausea and/or vomiting occur, drink only clear liquids until the nausea and/or vomiting subsides. Call your physician if vomiting continues.  Special Instructions/Symptoms: Your throat may feel dry or sore from the anesthesia or the breathing tube placed in your throat during surgery. If this causes discomfort, gargle with warm salt water. The discomfort should disappear within 24 hours.   Regional Anesthesia Blocks  1. Numbness or the inability to move the "blocked" extremity may last from 3-48 hours after placement. The length of time depends on the medication injected and your individual response to the medication. If the numbness is not going away after 48 hours, call your surgeon.  2. The extremity that is blocked will need to be protected until the numbness is gone and the  Strength has returned. Because you cannot feel it, you will need to take extra care to avoid injury. Because it may be weak, you may have difficulty moving it or using it.  You may not know what position it is in without looking at it while the block is in effect.  3. For blocks in the legs and feet, returning to weight bearing and walking needs to be done carefully. You will need to wait until the numbness is entirely gone and the strength has returned. You should be able to move your leg and foot normally before you try and bear weight or walk. You will need someone to be with you when you first try to ensure you do not fall and possibly risk injury.  4. Bruising and tenderness  at the needle site are common side effects and will resolve in a few days.  5. Persistent numbness or new problems with movement should be communicated to the surgeon or the [facility] [phone] [facility] [phone]).

## 2022-05-19 NOTE — Anesthesia Postprocedure Evaluation (Signed)
Anesthesia Post Note  Patient: [name]  Procedure(s) Performed: ARTHRODESIS METATARSALPHALANGEAL JOINT (MTPJ) RIGHT HALLUX (Right: Toe) WEIL OSTEOTOMY, 2ND METATARSAL (Right: Toe)     Patient location during evaluation: PACU Anesthesia Type: General Level of consciousness: awake and alert Pain management: pain level controlled Vital Signs Assessment: post-procedure vital signs reviewed and stable Respiratory status: spontaneous breathing, nonlabored ventilation, respiratory function stable and patient connected to nasal cannula oxygen Cardiovascular status: blood pressure returned to baseline and stable Postop Assessment: no apparent nausea or vomiting Anesthetic complications: no  No notable events documented.  Last Vitals:  Vitals:   05/19/22 1327 05/19/22 1330  BP: 136/73 132/76  Pulse: 82 94  Resp: 15 15  Temp: (!) 36.2 C   SpO2: 100% 100%    Last Pain:  Vitals:   05/19/22 1327  TempSrc:   PainSc: Ardean Larsen

## 2022-05-19 NOTE — H&P (Signed)
[name] is an 57 y.o. female.   Chief Complaint: Right foot pain HPI: 57 year old female without significant past medical history complains of worsening right foot pain due to a prominent bunion deformity, hallux rigidus and metatarsalgia.  She has failed nonoperative treatment including activity modification, oral anti-inflammatories and shoewear modification.  She presents today for surgical treatment of these painful right forefoot conditions.  Past Medical History:  Diagnosis Date   Arthritis    Estrogen deficiency    High cholesterol     Past Surgical History:  Procedure Laterality Date   ABDOMINAL HYSTERECTOMY     BREAST BIOPSY Right 01/21/2022   MM RT BREAST BX W LOC DEV 1ST LESION IMAGE BX SPEC STEREO GUIDE 01/21/2022 [facility]   BREAST BIOPSY Right 01/21/2022   MM RT BREAST BX W LOC DEV EA AD LESION IMG BX SPEC STEREO GUIDE 01/21/2022 [facility]   BREAST BIOPSY  03/16/2022   MM RT RADIOACTIVE SEED EA ADD LESION LOC MAMMO GUIDE 03/16/2022 [facility]   BREAST BIOPSY  03/16/2022   MM RT RADIOACTIVE SEED LOC MAMMO GUIDE 03/16/2022 [facility]   BREAST LUMPECTOMY WITH RADIOACTIVE SEED LOCALIZATION Right 03/17/2022   Procedure: RIGHT BREAST LUMPECTOMY WITH RADIOACTIVE SEED LOCALIZATION X2;  Surgeon: Abigail Miyamoto, MD;  Location: [facility];  Service: General;  Laterality: Right;    Family History  Problem Relation Age of Onset   Breast cancer Paternal Grandmother    Social History:  reports that she has never smoked. She has never used smokeless tobacco. She reports that she does not drink alcohol and does not use drugs.  Allergies:  Allergies  Allergen Reactions   Other Rash    With surgical glue (dermabond)   Sulfa Antibiotics Rash   Tape Rash    Medications Prior to Admission  Medication Sig Dispense Refill   atorvastatin (LIPITOR) 20 MG tablet Take 20 mg by mouth daily.     estradiol (CLIMARA - DOSED IN MG/24 HR)  0.05 mg/24hr patch Place 0.05 mg onto the skin once a week.      No results found for this or any previous visit (from the past 48 hour(s)). No results found.  Review of Systems no recent fever,, nausea, vomiting or changes in her appetite  Height 5\' 7"  (1.702 m), weight 77.1 kg. Physical Exam  Well-nourished well-developed woman in no apparent distress.  Alert and oriented.  Normal mood and affect.  Gait is normal.  The right foot has a prominent bunion deformity and a second hammertoe.  The right foot has healthy skin and palpable pulses.  Normal sensibility to light touch dorsally and plantarly at the forefoot.   Assessment/Plan Painful right foot bunion deformity, metatarsalgia, hallux rigidus -to the operating room today for surgical correction of these painful conditions.  The risks and benefits of the alternative treatment options have been discussed in detail.  The patient wishes to proceed with surgery and specifically understands risks of bleeding, infection, nerve damage, blood clots, need for additional surgery, amputation and death.   Toni Arthurs, MD 05/26/22, 9:36 AM

## 2022-05-19 NOTE — Anesthesia Procedure Notes (Signed)
Anesthesia Regional Block: Popliteal block   Pre-Anesthetic Checklist: , timeout performed,  Correct Patient, Correct Site, Correct Laterality,  Correct Procedure, Correct Position, site marked,  Risks and benefits discussed,  Surgical consent,  Pre-op evaluation,  At surgeon's request and post-op pain management  Laterality: Right  Prep: chloraprep       Needles:  Injection technique: Single-shot  Needle Type: Echogenic Stimulator Needle          Additional Needles:   Procedures:, nerve stimulator,,,,,     Nerve Stimulator or Paresthesia:  Response: plantar flexion of foot, 0.45 mA  Additional Responses:   Narrative:  Start time: 05/19/2022 10:44 AM End time: 05/19/2022 10:53 AM Injection made incrementally with aspirations every 5 mL.  Performed by: Personally  Anesthesiologist: Achille Rich, MD  Additional Notes: Functioning IV was confirmed and monitors were applied.  A 70mm 21ga Arrow echogenic stimulator needle was used. Sterile prep and drape,hand hygiene and sterile gloves were used.  Negative aspiration and negative test dose prior to incremental administration of local anesthetic. The patient tolerated the procedure well.  Ultrasound guidance: relevent anatomy identified, needle position confirmed, local anesthetic spread visualized around nerve(s), vascular puncture avoided.  Image printed for medical record.

## 2022-05-19 NOTE — Transfer of Care (Signed)
Immediate Anesthesia Transfer of Care Note  Patient: [name]  Procedure(s) Performed: ARTHRODESIS METATARSALPHALANGEAL JOINT (MTPJ) RIGHT HALLUX (Right: Toe) WEIL OSTEOTOMY, 2ND METATARSAL (Right: Toe)  Patient Location: PACU  Anesthesia Type:GA combined with regional for post-op pain  Level of Consciousness: drowsy  Airway & Oxygen Therapy: Patient Spontanous Breathing and Patient connected to face mask oxygen  Post-op Assessment: Report given to RN and Post -op Vital signs reviewed and stable  Post vital signs: Reviewed and stable  Last Vitals:  Vitals Value Taken Time  BP 136/73 05/19/22 1327  Temp    Pulse 82 05/19/22 1328  Resp 15 05/19/22 1328  SpO2 100 % 05/19/22 1328  Vitals shown include unvalidated device data.  Last Pain:  Vitals:   05/19/22 0951  TempSrc: Oral  PainSc: 0-No pain      Patients Stated Pain Goal: 5 (05/19/22 0951)  Complications: No notable events documented.

## 2022-05-19 NOTE — Progress Notes (Signed)
Assisted Dr. Hodierne with right, popliteal, ultrasound guided block. Side rails up, monitors on throughout procedure. See vital signs in flow sheet. Tolerated Procedure well. ?

## 2022-05-19 NOTE — Anesthesia Preprocedure Evaluation (Signed)
Anesthesia Evaluation  Patient identified by MRN, date of birth, ID band Patient awake    Reviewed: Allergy & Precautions, H&P , NPO status , Patient's Chart, lab work & pertinent test results  Airway Mallampati: II   Neck ROM: full    Dental   Pulmonary neg pulmonary ROS   breath sounds clear to auscultation       Cardiovascular negative cardio ROS  Rhythm:regular Rate:Normal     Neuro/Psych    GI/Hepatic   Endo/Other    Renal/GU      Musculoskeletal  (+) Arthritis ,    Abdominal   Peds  Hematology   Anesthesia Other Findings   Reproductive/Obstetrics                             Anesthesia Physical Anesthesia Plan  ASA: 2  Anesthesia Plan: General   Post-op Pain Management: Regional block*   Induction: Intravenous  PONV Risk Score and Plan: 3 and Ondansetron, Dexamethasone, Midazolam and Treatment may vary due to age or medical condition  Airway Management Planned: LMA  Additional Equipment:   Intra-op Plan:   Post-operative Plan: Extubation in OR  Informed Consent: I have reviewed the patients History and Physical, chart, labs and discussed the procedure including the risks, benefits and alternatives for the proposed anesthesia with the patient or authorized representative who has indicated his/her understanding and acceptance.     Dental advisory given  Plan Discussed with: CRNA, Anesthesiologist and Surgeon  Anesthesia Plan Comments:        Anesthesia Quick Evaluation

## 2022-05-20 ENCOUNTER — Encounter (HOSPITAL_BASED_OUTPATIENT_CLINIC_OR_DEPARTMENT_OTHER): Payer: Self-pay | Admitting: Orthopedic Surgery

## 2022-08-03 IMAGING — MG MM DIGITAL SCREENING BILAT W/ TOMO AND CAD
8 series · 8 of 24 positions shown · non-contrast
Comparison: Previous exam(s).

CLINICAL DATA: Screening.

EXAM:
DIGITAL SCREENING BILATERAL MAMMOGRAM WITH TOMOSYNTHESIS AND CAD
TECHNIQUE: Bilateral screening digital craniocaudal and mediolateral oblique
mammograms were obtained. Bilateral screening digital breast
tomosynthesis was performed. The images were evaluated with
computer-aided detection.

[L CC synth-2D]
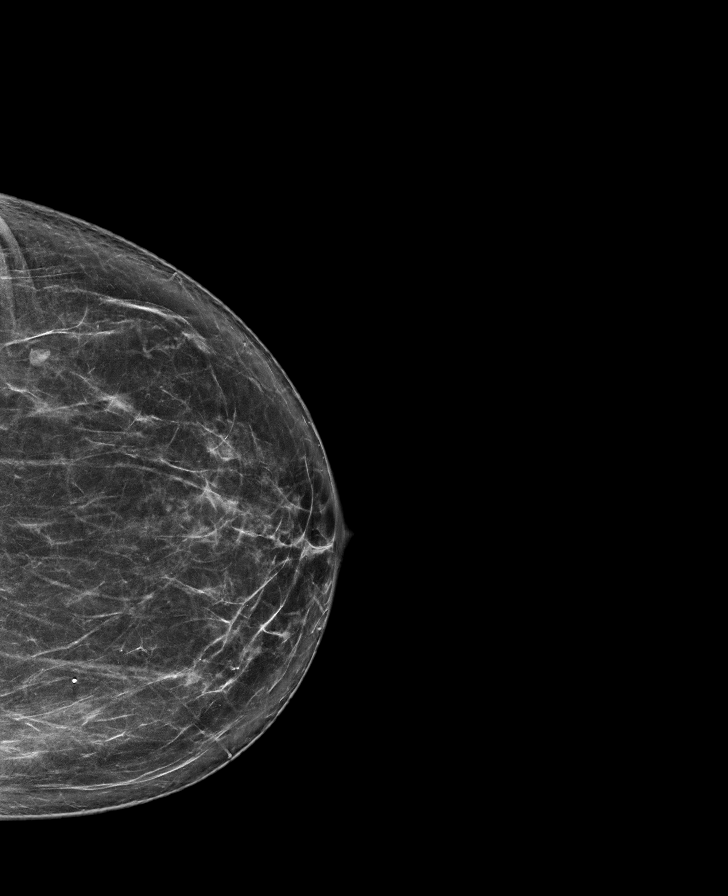

[L MLO synth-2D]
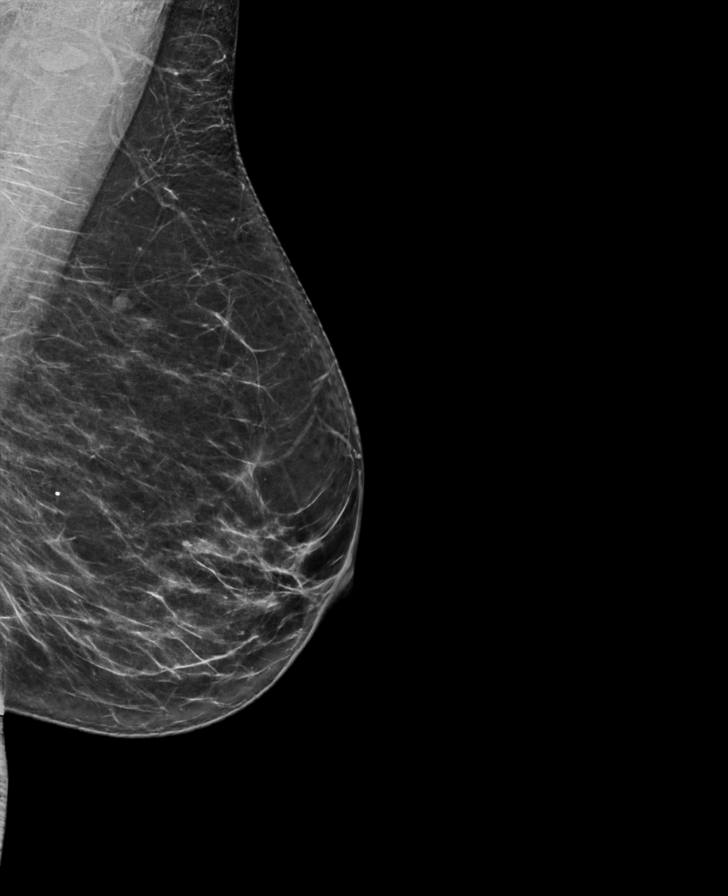

[R MLO synth-2D]
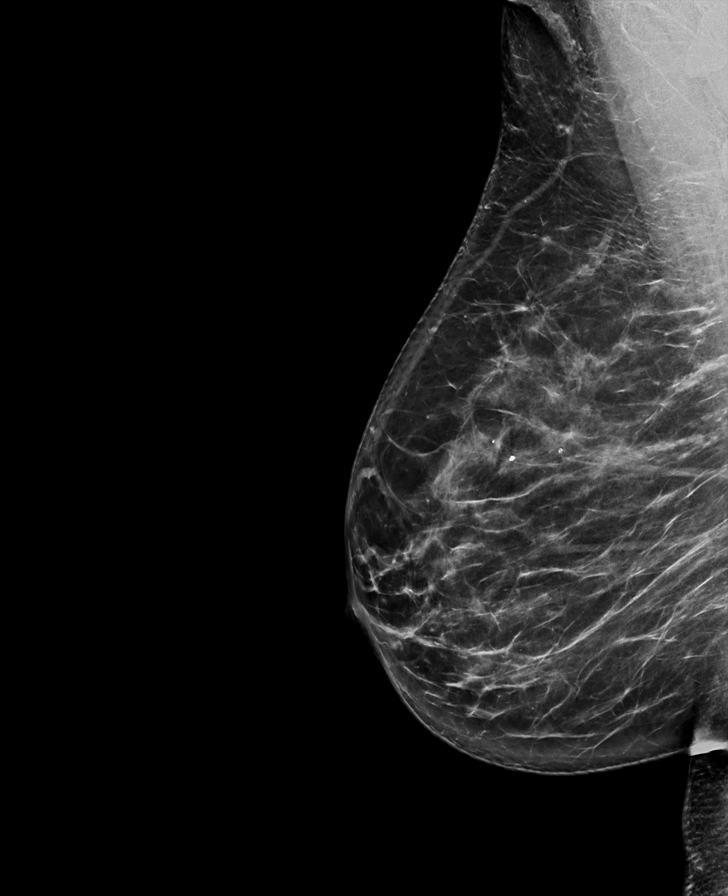

[R CC synth-2D]
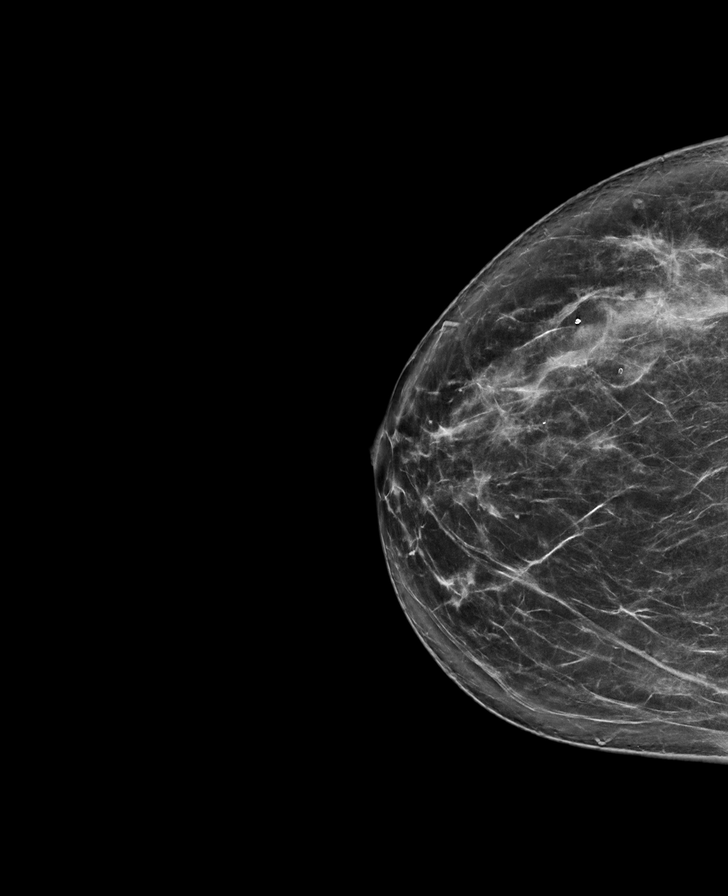

[R CC tomo · tomo slice 41/81.0]
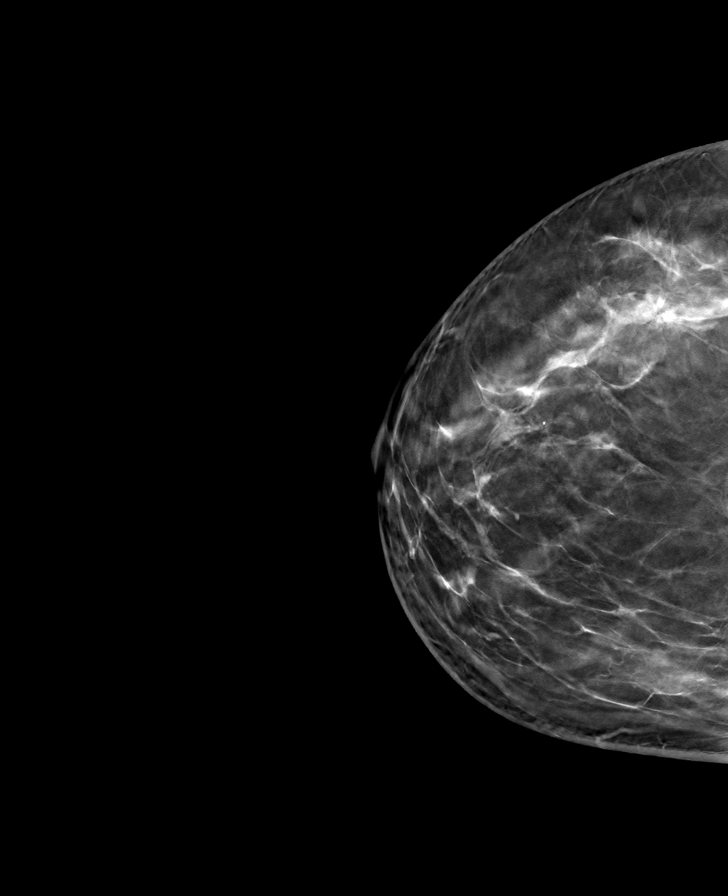

[R MLO tomo · tomo slice 41/82.0]
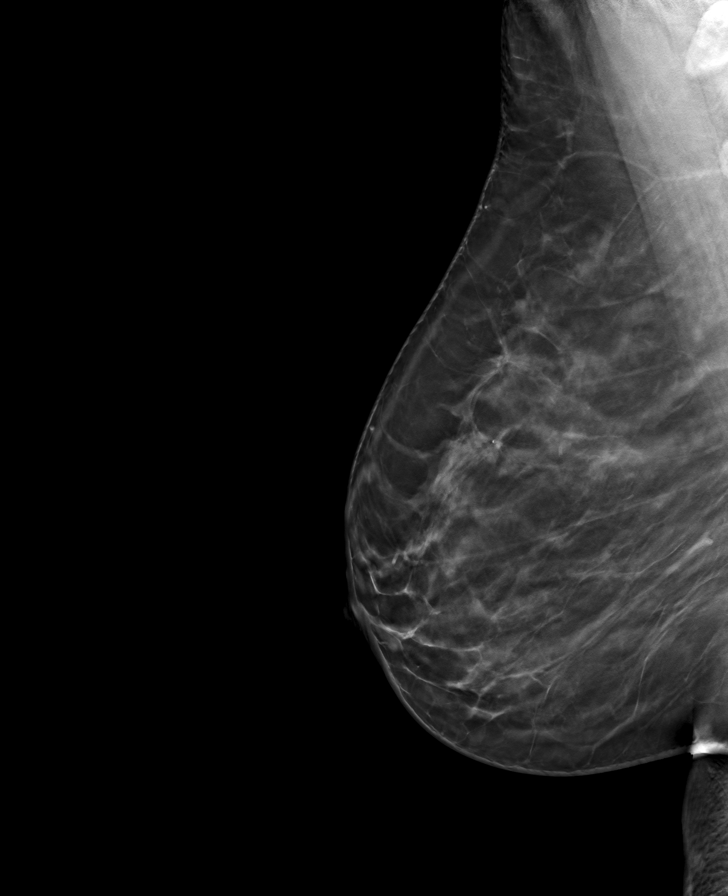

[L CC tomo · tomo slice 39/78.0]
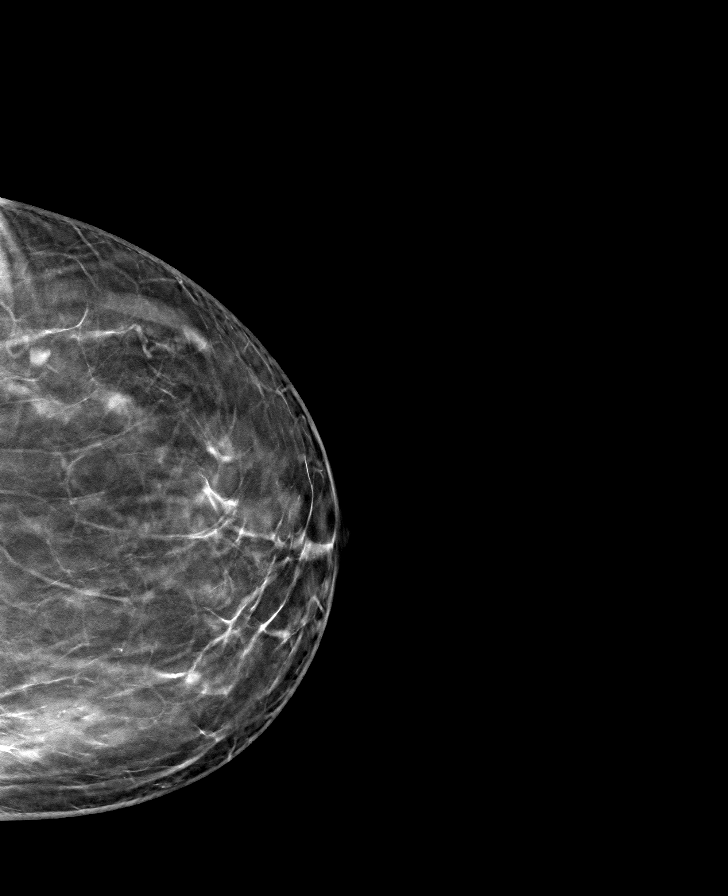

[L MLO tomo · tomo slice 39/76.0]
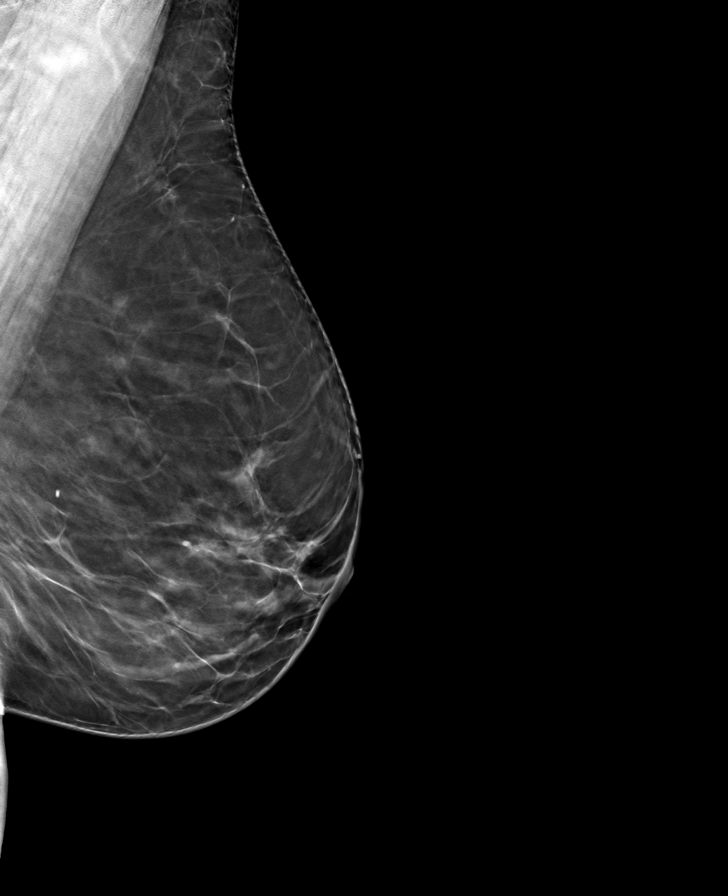

[8 of 24 positions shown; findings below may reference images not displayed]

ACR Breast Density Category b: There are scattered areas of
fibroglandular density.
FINDINGS: In the left breast, a possible mass warrants further evaluation.
This possible mass is seen within the inner LEFT breast, cc slice
33.

In the right breast, no findings suspicious for malignancy.
IMPRESSION: Further evaluation is suggested for a possible mass in the left
breast.

RECOMMENDATION:
Diagnostic mammogram and possibly ultrasound of the left breast.
(Code:F7-5-CCA)

The patient will be contacted regarding the findings, and additional
imaging will be scheduled.

BI-RADS CATEGORY  0: Incomplete. Need additional imaging evaluation
and/or prior mammograms for comparison.

## 2022-08-07 IMAGING — US US BREAST*L* LIMITED INC AXILLA
1 series · 6 of 6 positions shown · non-contrast
Comparison: Previous exam(s).

CLINICAL DATA: Screening recall for a possible left breast mass.

EXAM:
DIGITAL DIAGNOSTIC UNILATERAL LEFT MAMMOGRAM WITH TOMOSYNTHESIS AND
CAD; ULTRASOUND LEFT BREAST LIMITED
TECHNIQUE: Left digital diagnostic mammography and breast tomosynthesis was
performed. The images were evaluated with computer-aided detection.;
Targeted ultrasound examination of the left breast was performed.

[Series 1: us breast*left* limited inc axilla · 0.06mm/px · 6 of 6 slices shown]
[im 1/6]
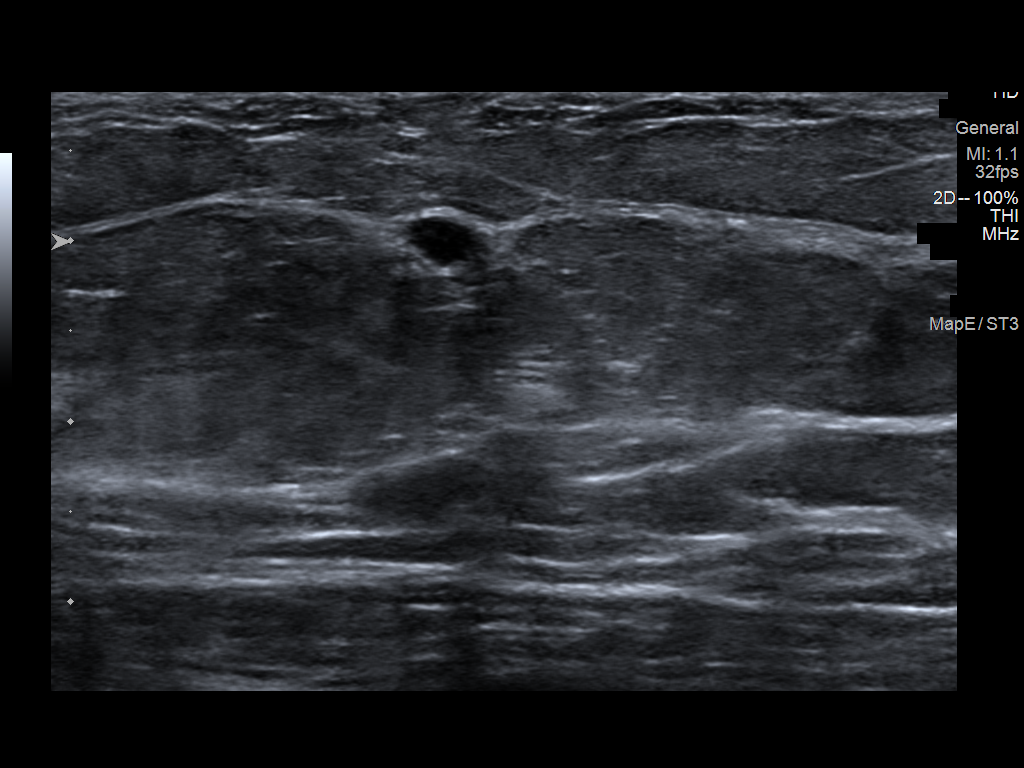
[im 2/6]
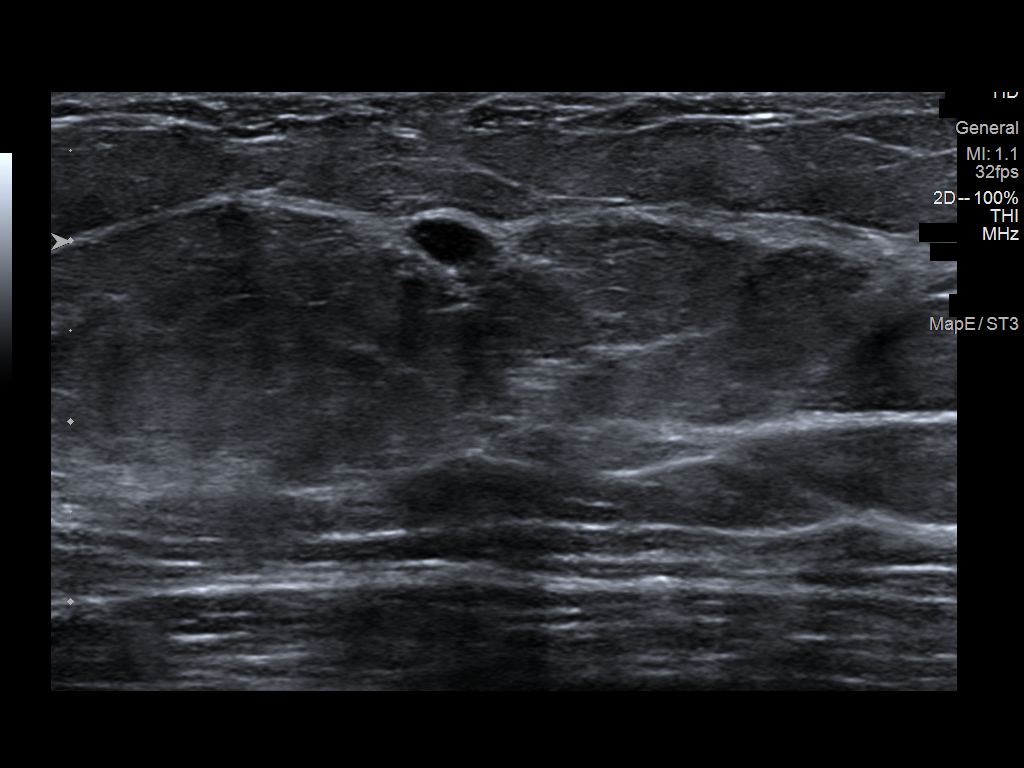
[im 3/6]
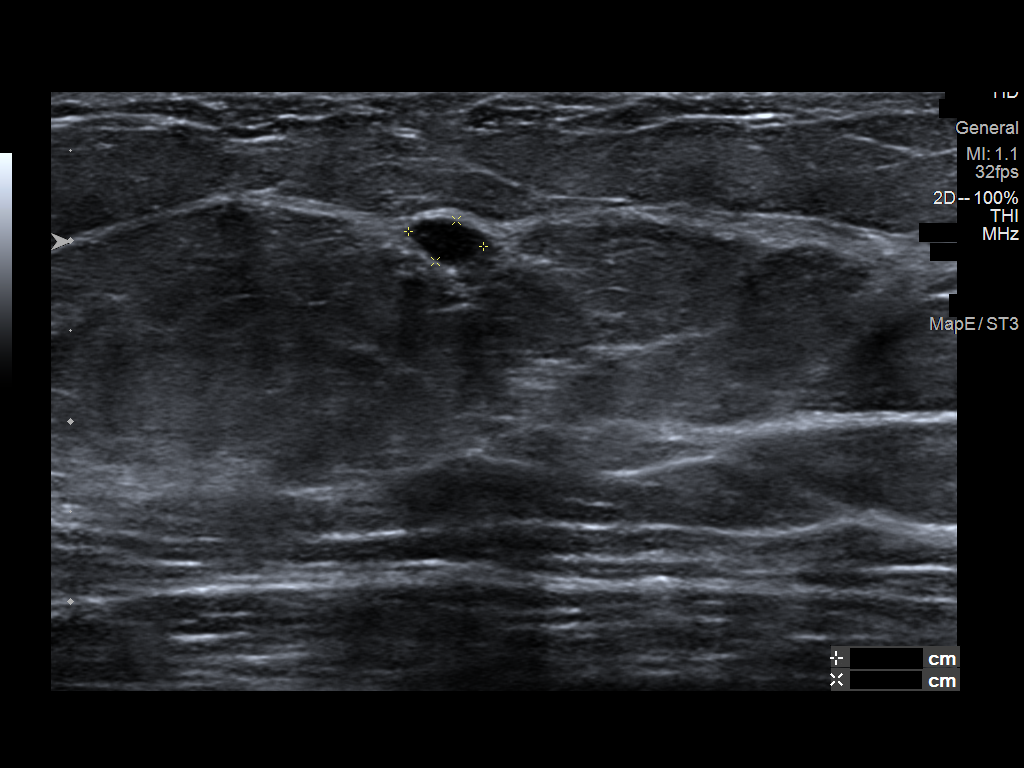
[im 4/6]
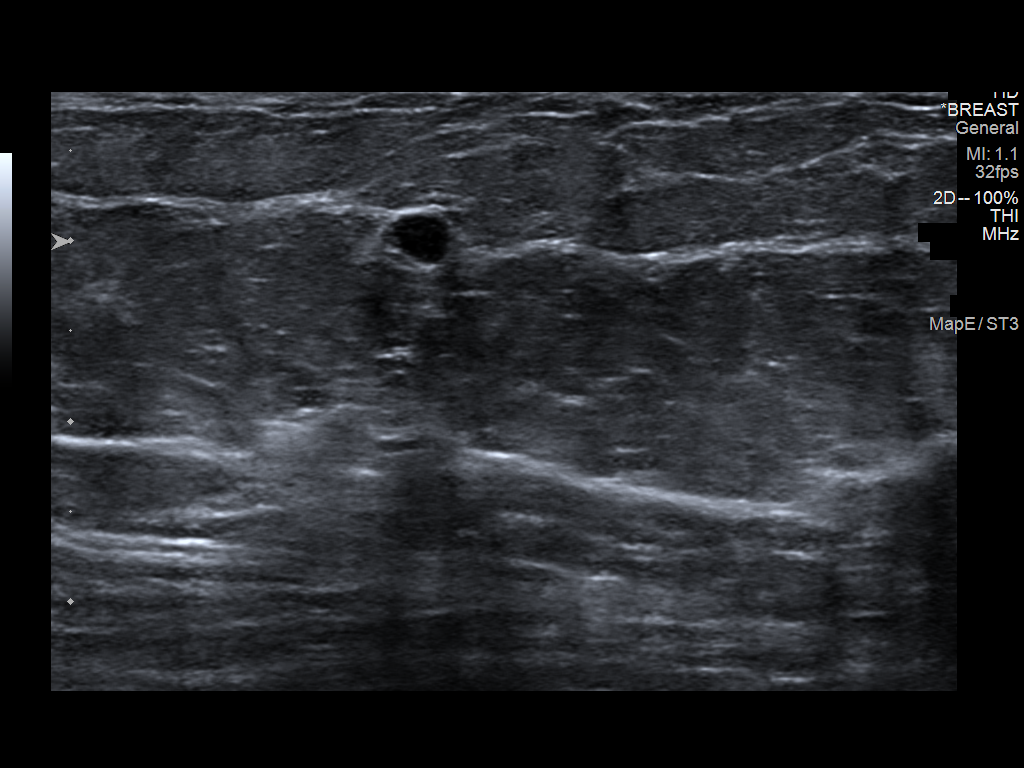
[im 5/6]
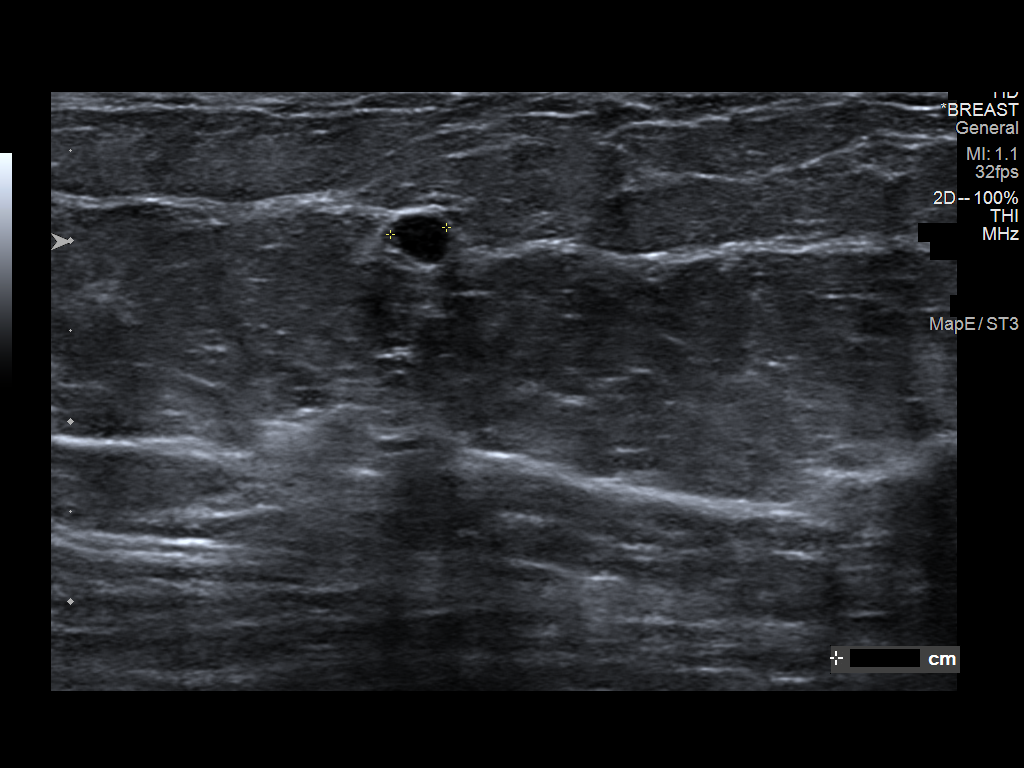
[im 6/6]
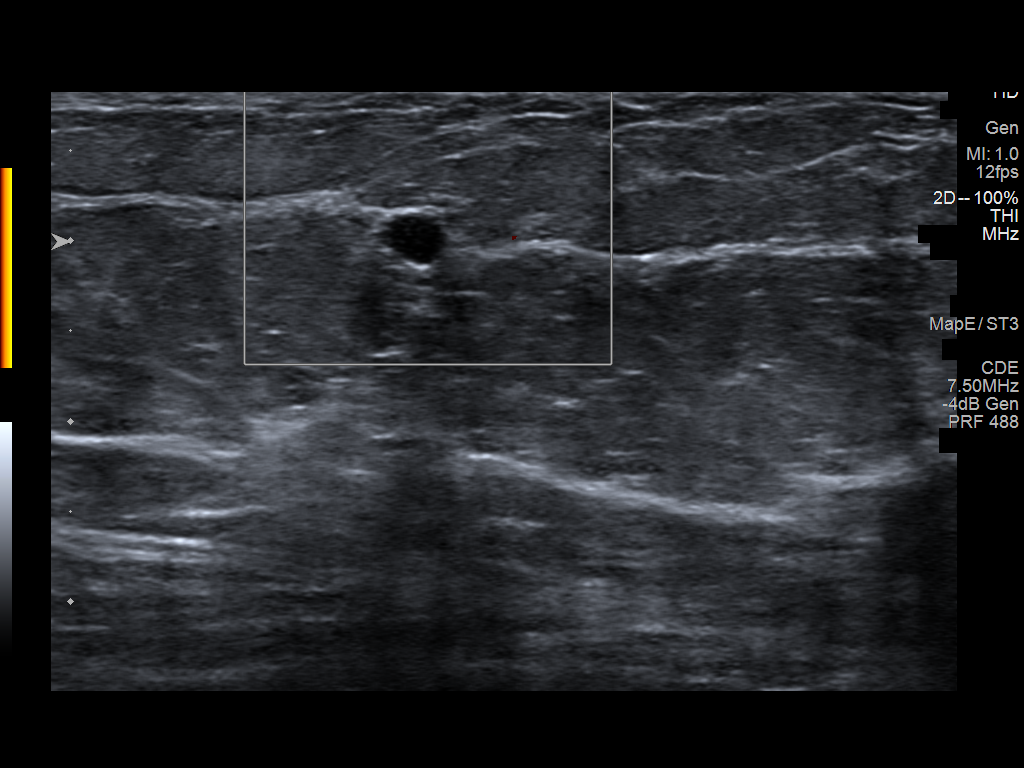

[6 of 6 positions shown; findings below may reference images not displayed]

ACR Breast Density Category b: There are scattered areas of
fibroglandular density.
FINDINGS: On the diagnostic images, the possible mass noted in the left breast
persists as a 4 mm, oval, circumscribed mass in the upper inner
quadrant.

Targeted ultrasound is performed, showing a small cyst in the left
breast at 10 o'clock, 4 cm the nipple, measuring 4 x 3 x 3 mm,
consistent in size, shape and location to the mammographic mass.
IMPRESSION: 1. No evidence of breast malignancy.
2. Small benign left breast cyst.

RECOMMENDATION:
Screening mammogram in one year.(Code:G7-5-A1L)

I have discussed the findings and recommendations with the patient.
If applicable, a reminder letter will be sent to the patient
regarding the next appointment.

BI-RADS CATEGORY  2: Benign.

## 2022-08-22 ENCOUNTER — Other Ambulatory Visit: Payer: Self-pay | Admitting: Nurse Practitioner

## 2022-08-22 DIAGNOSIS — Z1231 Encounter for screening mammogram for malignant neoplasm of breast: Secondary | ICD-10-CM

## 2022-12-26 ENCOUNTER — Ambulatory Visit: Payer: BC Managed Care – PPO

## 2023-01-16 ENCOUNTER — Ambulatory Visit
Admission: RE | Admit: 2023-01-16 | Discharge: 2023-01-16 | Disposition: A | Discharge status: Home / Self Care | Payer: BC Managed Care – PPO | Source: Ambulatory Visit | Attending: Nurse Practitioner | Admitting: Nurse Practitioner

## 2023-01-16 DIAGNOSIS — Z1231 Encounter for screening mammogram for malignant neoplasm of breast: Secondary | ICD-10-CM

## 2023-11-28 ENCOUNTER — Other Ambulatory Visit: Payer: Self-pay | Admitting: Physician Assistant

## 2023-11-28 DIAGNOSIS — Z1231 Encounter for screening mammogram for malignant neoplasm of breast: Secondary | ICD-10-CM

## 2024-01-17 ENCOUNTER — Ambulatory Visit
Admission: RE | Admit: 2024-01-17 | Discharge: 2024-01-17 | Disposition: A | Discharge status: Home / Self Care | Source: Ambulatory Visit | Attending: Physician Assistant | Admitting: Physician Assistant

## 2024-01-17 DIAGNOSIS — Z1231 Encounter for screening mammogram for malignant neoplasm of breast: Secondary | ICD-10-CM
